# Patient Record
Sex: Male | Born: 1945 | Race: Black or African American | Hispanic: No | Smoking: Never smoker
Health system: Southern US, Community
[De-identification: ages and names within clinical notes are randomized; demographics above are authoritative.]

## PROBLEM LIST (undated history)

## (undated) DIAGNOSIS — F0390 Unspecified dementia without behavioral disturbance: Secondary | ICD-10-CM

## (undated) DIAGNOSIS — I639 Cerebral infarction, unspecified: Secondary | ICD-10-CM

## (undated) DIAGNOSIS — I10 Essential (primary) hypertension: Secondary | ICD-10-CM

## (undated) DIAGNOSIS — E785 Hyperlipidemia, unspecified: Secondary | ICD-10-CM

## (undated) DIAGNOSIS — E1149 Type 2 diabetes mellitus with other diabetic neurological complication: Secondary | ICD-10-CM

## (undated) DIAGNOSIS — D352 Benign neoplasm of pituitary gland: Secondary | ICD-10-CM

## (undated) HISTORY — PX: TONSILLECTOMY: SUR1361

## (undated) HISTORY — PX: KNEE ARTHROSCOPY: SUR90

## (undated) HISTORY — PX: OTHER SURGICAL HISTORY: SHX169

## (undated) HISTORY — PX: JOINT REPLACEMENT: SHX530

---

## 1999-10-29 ENCOUNTER — Encounter: Payer: Self-pay | Admitting: Physical Medicine and Rehabilitation

## 1999-10-29 ENCOUNTER — Ambulatory Visit (HOSPITAL_COMMUNITY)
Admission: RE | Admit: 1999-10-29 | Discharge: 1999-10-29 | Payer: Self-pay | Admitting: Physical Medicine and Rehabilitation

## 2000-10-31 ENCOUNTER — Emergency Department (HOSPITAL_COMMUNITY): Admission: EM | Admit: 2000-10-31 | Discharge: 2000-10-31 | Payer: Self-pay | Admitting: Emergency Medicine

## 2001-06-26 ENCOUNTER — Encounter: Payer: Self-pay | Admitting: Emergency Medicine

## 2001-06-26 ENCOUNTER — Emergency Department (HOSPITAL_COMMUNITY): Admission: EM | Admit: 2001-06-26 | Discharge: 2001-06-26 | Payer: Self-pay | Admitting: Emergency Medicine

## 2011-10-25 ENCOUNTER — Emergency Department (HOSPITAL_COMMUNITY): Payer: Medicare Other

## 2011-10-25 ENCOUNTER — Encounter (HOSPITAL_COMMUNITY): Payer: Self-pay | Admitting: *Deleted

## 2011-10-25 ENCOUNTER — Emergency Department (HOSPITAL_COMMUNITY)
Admission: EM | Admit: 2011-10-25 | Discharge: 2011-10-26 | Disposition: A | Discharge status: Home / Self Care | Payer: Medicare Other | Attending: Emergency Medicine | Admitting: Emergency Medicine

## 2011-10-25 DIAGNOSIS — E119 Type 2 diabetes mellitus without complications: Secondary | ICD-10-CM | POA: Insufficient documentation

## 2011-10-25 DIAGNOSIS — R221 Localized swelling, mass and lump, neck: Secondary | ICD-10-CM

## 2011-10-25 DIAGNOSIS — R531 Weakness: Secondary | ICD-10-CM

## 2011-10-25 DIAGNOSIS — R739 Hyperglycemia, unspecified: Secondary | ICD-10-CM

## 2011-10-25 DIAGNOSIS — R5381 Other malaise: Secondary | ICD-10-CM | POA: Insufficient documentation

## 2011-10-25 DIAGNOSIS — R220 Localized swelling, mass and lump, head: Secondary | ICD-10-CM | POA: Insufficient documentation

## 2011-10-25 DIAGNOSIS — Z794 Long term (current) use of insulin: Secondary | ICD-10-CM | POA: Insufficient documentation

## 2011-10-25 DIAGNOSIS — I10 Essential (primary) hypertension: Secondary | ICD-10-CM | POA: Insufficient documentation

## 2011-10-25 HISTORY — DX: Essential (primary) hypertension: I10

## 2011-10-25 LAB — GLUCOSE, CAPILLARY
Glucose-Capillary: 535 mg/dL — ABNORMAL HIGH (ref 70–99)
Glucose-Capillary: 394 mg/dL — ABNORMAL HIGH (ref 70–99)

## 2011-10-25 LAB — DIFFERENTIAL
Lymphocytes Relative: 35 % (ref 12–46)
Lymphs Abs: 1.9 10*3/uL (ref 0.7–4.0)
Monocytes Absolute: 0.4 10*3/uL (ref 0.1–1.0)
Monocytes Relative: 8 % (ref 3–12)
Neutro Abs: 2.7 10*3/uL (ref 1.7–7.7)
Neutrophils Relative %: 51 % (ref 43–77)

## 2011-10-25 LAB — HEPATIC FUNCTION PANEL
ALT: 20 U/L (ref 0–53)
AST: 20 U/L (ref 0–37)
Albumin: 4.4 g/dL (ref 3.5–5.2)
Bilirubin, Direct: 0.1 mg/dL (ref 0.0–0.3)
Total Bilirubin: 0.3 mg/dL (ref 0.3–1.2)

## 2011-10-25 LAB — BASIC METABOLIC PANEL
BUN: 17 mg/dL (ref 6–23)
CO2: 21 mEq/L (ref 19–32)
Chloride: 100 mEq/L (ref 96–112)
Creatinine, Ser: 1.05 mg/dL (ref 0.50–1.35)
Potassium: 3.9 mEq/L (ref 3.5–5.1)

## 2011-10-25 LAB — URINALYSIS, ROUTINE W REFLEX MICROSCOPIC
Glucose, UA: >1000 mg/dL — AB
Hgb urine dipstick: NEGATIVE
Protein, ur: NEGATIVE mg/dL
Specific Gravity, Urine: 1.033 — ABNORMAL HIGH (ref 1.005–1.030)
Urobilinogen, UA: 0.2 mg/dL (ref 0.0–1.0)

## 2011-10-25 LAB — URINE MICROSCOPIC-ADD ON

## 2011-10-25 LAB — CBC
HCT: 42.9 % (ref 39.0–52.0)
Hemoglobin: 14.7 g/dL (ref 13.0–17.0)
RBC: 4.7 MIL/uL (ref 4.22–5.81)
WBC: 5.3 10*3/uL (ref 4.0–10.5)

## 2011-10-25 MED ORDER — INSULIN REGULAR HUMAN 100 UNIT/ML IJ SOLN: 10.0000 [IU] | Freq: Once | INTRAMUSCULAR | Status: DC

## 2011-10-25 MED ORDER — INSULIN ASPART 100 UNIT/ML ~~LOC~~ SOLN
10.0000 [IU] | Freq: Once | SUBCUTANEOUS | Status: AC
  Administered 2011-10-25: 10 [IU] via INTRAVENOUS
  Filled 2011-10-25: qty 1

## 2011-10-25 MED ORDER — SODIUM CHLORIDE 0.9 % IV BOLUS (SEPSIS)
1000.0000 mL | Freq: Once | INTRAVENOUS | Status: AC
  Administered 2011-10-25: 1000 mL via INTRAVENOUS

## 2011-10-25 NOTE — ED Notes (Signed)
Patient sts he has been having trouble with his new insulin and that he has been having headache, tingling in his fingers, dizziness and lightheadedness. Patient sts that he has some trouble communicating with his PCP. Patient sts he has been having trouble with the new insulin his PCP prescribed him and has tried to explain this to him, but PCP will not prescribe his old insulin. Sts he believes this is where his hyperglycemia is coming from.

## 2011-10-25 NOTE — ED Provider Notes (Addendum)
History     CSN: [id number]  Arrival date & time 10/25/11  4098   First MD Initiated Contact with Patient 10/25/11 2133      Chief Complaint  Patient presents with  . Hyperglycemia    (Consider location/radiation/quality/duration/timing/severity/associated sxs/prior treatment) HPI  H/o IDDM pw hyperglycemia. Notes glu 400s-500s all day. Has been taking insulin as prescribed. States glucose was controlled previously but started on insulin pens 2-3 weeks ago. He is unsure if he's using them correctly. +Increased thirst and urination. C/O generalized weakness. Also with worsening productive cough. Denies cp/sob. Nausea without vomiting x 2-3 days. Denies cp/sob/palpitations. Denies fever/chills. +urinary frequency, denies urinary urgency, hematuria. Denies sick contacts. Has b/l hand/finger numbness present for years and worsened over past few weeks. Also concerned about left neck swelling which has been present for 5-6 years and worse over past 8 months.    ED Notes, ED Provider Notes from 10/25/11 0000 to 10/25/11 20:42:31       Tammy Clois Comber, RN 10/25/2011 20:40      Pt presents with elevated glucose; changed from novolin/regular to a "pen" form of insulin; pt states since the change his sugars have remained elevated; numbness in fingers for "years"-increase episodes lately     Past Medical History  Diagnosis Date  . Diabetes mellitus   . Hypertension     Past Surgical History  Procedure Date  . Knee arthroscopy     No family history on file.  History  Substance Use Topics  . Smoking status: Never Smoker   . Smokeless tobacco: Not on file  . Alcohol Use: No    Review of Systems  All other systems reviewed and are negative.  except as noted HPI   Allergies  Review of patient's allergies indicates no known allergies.  Home Medications   Current Outpatient Rx  Name Route Sig Dispense Refill  . INSULIN ASPART PROT & ASPART (70-30) 100 UNIT/ML  SUSP  Subcutaneous Inject 12 Units into the skin 3 (three) times daily.      BP 148/88  Pulse 102  Temp(Src) 99 F (37.2 C) (Oral)  Resp 18  Wt 230 lb (104.327 kg)  SpO2 100%  Physical Exam  Nursing note and vitals reviewed. Constitutional: He is oriented to person, place, and time. He appears well-developed and well-nourished. No distress.  HENT:  Head: Atraumatic.  Mouth/Throat: Oropharynx is clear and moist.  Eyes: Conjunctivae are normal. Pupils are equal, round, and reactive to light.  Neck: Neck supple.       Lt LAD v/s superficial mass  Cardiovascular: Normal rate, regular rhythm, normal heart sounds and intact distal pulses.  Exam reveals no gallop and no friction rub.   No murmur heard. Pulmonary/Chest: Effort normal. No respiratory distress. He has no wheezes. He has no rales.  Abdominal: Soft. Bowel sounds are normal. There is no tenderness. There is no rebound and no guarding.  Musculoskeletal: Normal range of motion. He exhibits no edema and no tenderness.  Neurological: He is alert and oriented to person, place, and time.  Skin: Skin is warm and dry.  Psychiatric: He has a normal mood and affect.    Date: 11/23/2011  Rate: 79  Rhythm: normal sinus rhythm  QRS Axis: left  Intervals: normal  ST/T Wave abnormalities: normal  Conduction Disutrbances:left anterior fascicular block  Narrative Interpretation:   Old EKG Reviewed: none to compare    ED Course  Procedures (including critical care time)  Labs Reviewed  GLUCOSE, CAPILLARY -  Abnormal; Notable for the following:    Glucose-Capillary 535 (*)    All other components within normal limits  DIFFERENTIAL - Abnormal; Notable for the following:    Eosinophils Relative 6 (*)    All other components within normal limits  BASIC METABOLIC PANEL - Abnormal; Notable for the following:    Sodium 134 (*)    Glucose, Bld 469 (*)    GFR calc non Af Amer 73 (*)    GFR calc Af Amer 84 (*)    All other components within  normal limits  HEPATIC FUNCTION PANEL - Abnormal; Notable for the following:    Alkaline Phosphatase 138 (*)    Indirect Bilirubin 0.2 (*)    All other components within normal limits  URINALYSIS, ROUTINE W REFLEX MICROSCOPIC - Abnormal; Notable for the following:    Specific Gravity, Urine 1.033 (*)    Glucose, UA >1000 (*)    Bilirubin Urine MODERATE (*)    Ketones, ur TRACE (*)    All other components within normal limits  GLUCOSE, CAPILLARY - Abnormal; Notable for the following:    Glucose-Capillary 394 (*)    All other components within normal limits  GLUCOSE, CAPILLARY - Abnormal; Notable for the following:    Glucose-Capillary 402 (*)    All other components within normal limits  CBC  TROPONIN I  URINE MICROSCOPIC-ADD ON   Dg Chest 2 View  10/25/2011  *RADIOLOGY REPORT*  Clinical Data: Hyperglycemia.  Diabetes.  CHEST - 2 VIEW  Comparison: None.  Findings: Midline trachea.  Normal heart size and mediastinal contours. No pleural effusion or pneumothorax.  Clear lungs.  IMPRESSION: No acute cardiopulmonary disease.  Original Report Authenticated By: Consuello Bossier, M.D.     1. Hyperglycemia   2. Weakness   3. Neck mass     MDM  65yoM h/o IDDM pw hyperglycemia and weakness. He states he's been compliant with his insulin however he is unsure if he's actually getting enough insulin out of his pens which are new for him. CXR and UA unremarkable for infection. Hyperglycemia but labs otherwise unremarkable. Bicarb wnl and AG 13. Will treat with IVF and insulin. Discharge home with PMD f/u when glucose controlled. He will call tomorrow for adjustments to his insulin regimen. To f/u with them for neck mass (chronic) as well.   Forbes Cellar, MD 10/25/11 8657  Forbes Cellar, MD 10/25/11 8469  Forbes Cellar, MD 11/23/11 1146  Forbes Cellar, MD 11/23/11 1146

## 2011-10-25 NOTE — Discharge Instructions (Signed)
Hyperglycemia Hyperglycemia occurs when the glucose (sugar) in your blood is too high. Hyperglycemia can happen for many reasons, but it most often happens to people who do not know they have diabetes or are not managing their diabetes properly.  CAUSES  Whether you have diabetes or not, there are other causes of hyperglycemia. Hyperglycemia can occur when you have diabetes, but it can also occur in other situations that you might not be as aware of, such as: Diabetes  If you have diabetes and are having problems controlling your blood glucose, hyperglycemia could occur because of some of the following reasons:   Not following your meal plan.   Not taking your diabetes medications or not taking it properly.   Exercising less or doing less activity than you normally do.   Being sick.  Pre-diabetes  This cannot be ignored. Before people develop Type 2 diabetes, they almost always have "pre-diabetes." This is when your blood glucose levels are higher than normal, but not yet high enough to be diagnosed as diabetes. Research has shown that some long-term damage to the body, especially the heart and circulatory system, may already be occurring during pre-diabetes. If you take action to manage your blood glucose when you have pre-diabetes, you may delay or prevent Type 2 diabetes from developing.  Stress  If you have diabetes, you may be "diet" controlled or on oral medications or insulin to control your diabetes. However, you may find that your blood glucose is higher than usual in the hospital whether you have diabetes or not. This is often referred to as "stress hyperglycemia." Stress can elevate your blood glucose. This happens because of hormones put out by the body during times of stress. If stress has been the cause of your high blood glucose, it can be followed regularly by your caregiver. That way he/she can make sure your hyperglycemia does not continue to get worse or progress to diabetes.    Steroids  Steroids are medications that act on the infection fighting system (immune system) to block inflammation or infection. One side effect can be a rise in blood glucose. Most people can produce enough extra insulin to allow for this rise, but for those who cannot, steroids make blood glucose levels go even higher. It is not unusual for steroid treatments to "uncover" diabetes that is developing. It is not always possible to determine if the hyperglycemia will go away after the steroids are stopped. A special blood test called an A1c is sometimes done to determine if your blood glucose was elevated before the steroids were started.  SYMPTOMS  Thirsty.   Frequent urination.   Dry mouth.   Blurred vision.   Tired or fatigue.   Weakness.   Sleepy.   Tingling in feet or leg.  DIAGNOSIS  Diagnosis is made by monitoring blood glucose in one or all of the following ways:  A1c test. This is a chemical found in your blood.   Fingerstick blood glucose monitoring.   Laboratory results.  TREATMENT  First, knowing the cause of the hyperglycemia is important before the hyperglycemia can be treated. Treatment may include, but is not be limited to:  Education.   Change or adjustment in medications.   Change or adjustment in meal plan.   Treatment for an illness, infection, etc.   More frequent blood glucose monitoring.   Change in exercise plan.   Decreasing or stopping steroids.   Lifestyle changes.  HOME CARE INSTRUCTIONS   Test your blood glucose   as directed.   Exercise regularly. Your caregiver will give you instructions about exercise. Pre-diabetes or diabetes which comes on with stress is helped by exercising.   Eat wholesome, balanced meals. Eat often and at regular, fixed times. Your caregiver or nutritionist will give you a meal plan to guide your sugar intake.   Being at an ideal weight is important. If needed, losing as little as 10 to 15 pounds may help  improve blood glucose levels.  SEEK MEDICAL CARE IF:   You have questions about medicine, activity, or diet.   You continue to have symptoms (problems such as increased thirst, urination, or weight gain).  SEEK IMMEDIATE MEDICAL CARE IF:   You are vomiting or have diarrhea.   Your breath smells fruity.   You are breathing faster or slower.   You are very sleepy or incoherent.   You have numbness, tingling, or pain in your feet or hands.   You have chest pain.   Your symptoms get worse even though you have been following your caregiver's orders.   If you have any other questions or concerns.  Document Released: 10/27/2000 Document Revised: 04/22/2011 Document Reviewed: 12/23/2008 ExitCare Patient Information 2012 [employer].  RESOURCE GUIDE  Dental Problems  Patients with Medicaid: [facility]                     [facility] [address]                                           [address] Phone:  [phone]                                                   Phone:  [phone]  If unable to pay or uninsured, contact:  [facility] or [facility] to become qualified for the adult dental clinic.  Chronic Pain Problems Contact [facility]  [phone] Patients need to be referred by their primary care doctor.  Insufficient Money for Medicine Contact [employer]:  call "211" or [facility] 271-5999.  No Primary Care Doctor Call [facility]  832-8000 Other agencies that provide inexpensive medical care    [facility]  [phone]    [facility]  [phone]    [facility]  [phone]    [facility]  [phone]    [facility]  [phone]    [facility]  [phone]  [facility]  [phone] [facility]  [phone] [facility]   [phone] (emergency services  [phone])  Abuse/Neglect [place] Child Abuse Hotline (336) 641-3795 [place] [facility] Abuse Hotline 800-378-5315 (After Hours)  Emergency Shelter  [facility] (336) 271-5985  [facility] (336) 275-9566 [facility] (704) 372-4663  MRSA Hotline #:   832-7006    [place] [facility]  [facility] [place]  [employer]                           [facility] [address] [place]                       [address]         [address]  [place]                                               [place]                              [place] Phone:  [phone]                                  Phone:  [phone]                   Phone:  [phone]  [facility] Phone:  [phone]  [place] Child Abuse Hotline (336) 342-1394 (336) 342-3537 (After Hours)  

## 2011-10-25 NOTE — ED Notes (Signed)
Pt presents with elevated glucose; changed from novolin/regular to a "pen" form of insulin; pt states since the change his sugars have remained elevated; numbness in fingers for "years"-increase episodes lately

## 2011-10-25 NOTE — ED Notes (Signed)
Urinal given to patient. No assistance needed.

## 2011-10-26 LAB — GLUCOSE, CAPILLARY

## 2011-10-26 MED ORDER — INSULIN NPH (HUMAN) (ISOPHANE) 100 UNIT/ML ~~LOC~~ SUSP: 12.0000 [IU] | Freq: Two times a day (BID) | SUBCUTANEOUS | Status: AC

## 2011-10-26 MED ORDER — INSULIN REGULAR HUMAN 100 UNIT/ML IJ SOLN: 25.0000 [IU] | Freq: Two times a day (BID) | INTRAMUSCULAR | Status: AC

## 2011-10-26 NOTE — ED Notes (Signed)
Per PharmD's request pt's insulin changed to novolin from Humulin, due to the novolin being covered by pt's insurance.  Ok by MD Bernette Mayers.

## 2011-10-26 NOTE — ED Provider Notes (Signed)
CBG improved. Pt has not had good control of sugar at home with Insulin pen and would like to return to his previous regimen. He is not sure of his prior dose but has it written down at home. Given 1 vial each of his prior meds and advised close followup with PCP at the [place].   Ruben Diaz B. Bernette Mayers, MD 10/26/11 [phone]

## 2011-10-26 NOTE — ED Notes (Signed)
Patient given discharge instructions, information, prescriptions, and diet order. Patient states that they adequately understand discharge information given and to return to ED if symptoms return or worsen.    Patient given information about hyperglycemia and ways to manage his blood sugar. Patients girlfriend at bedside to hear education as well. Patient sts that he understands.

## 2014-04-30 ENCOUNTER — Emergency Department (HOSPITAL_COMMUNITY)
Admission: EM | Admit: 2014-04-30 | Discharge: 2014-04-30 | Discharge status: Against Medical Advice | Payer: Medicare Other | Attending: Emergency Medicine | Admitting: Emergency Medicine

## 2014-04-30 ENCOUNTER — Encounter (HOSPITAL_COMMUNITY): Payer: Self-pay | Admitting: Emergency Medicine

## 2014-04-30 DIAGNOSIS — R531 Weakness: Secondary | ICD-10-CM | POA: Insufficient documentation

## 2014-04-30 DIAGNOSIS — I10 Essential (primary) hypertension: Secondary | ICD-10-CM | POA: Diagnosis not present

## 2014-04-30 DIAGNOSIS — E119 Type 2 diabetes mellitus without complications: Secondary | ICD-10-CM | POA: Insufficient documentation

## 2014-04-30 HISTORY — DX: Cerebral infarction, unspecified: I63.9

## 2014-04-30 NOTE — ED Notes (Addendum)
Patient observed walking out the back door towards triage.  Inquired with the patient where he was going.  Patient stated, "I'm going somewhere else, I ain't gonna lay here and die."  Informed patient he had not been in the ED for an hour yet and if he would return to his room, a provider would come see him shortly.  Patient responded, "I ain't waiting an hour, you crazy lady!  I'm going somewhere else."  Patient left via the front doors.

## 2014-04-30 NOTE — ED Notes (Signed)
Pt was visualized leaving the department, pt states "I don't want no help", ambulates to car.  Charge RN aware.

## 2014-04-30 NOTE — ED Notes (Signed)
Pt alert, arrives from home, drove self to ER, c/o weakness, loneliness, pt looking for someone to help him a little bit, ambulates to triage, resp even unlabored, skin pwd

## 2014-09-25 ENCOUNTER — Encounter (HOSPITAL_COMMUNITY): Payer: Self-pay

## 2014-09-25 ENCOUNTER — Emergency Department (HOSPITAL_COMMUNITY): Payer: Medicare Other

## 2014-09-25 ENCOUNTER — Emergency Department (HOSPITAL_COMMUNITY)
Admission: EM | Admit: 2014-09-25 | Discharge: 2014-09-25 | Disposition: A | Discharge status: Home / Self Care | Payer: Medicare Other | Attending: Emergency Medicine | Admitting: Emergency Medicine

## 2014-09-25 DIAGNOSIS — Z794 Long term (current) use of insulin: Secondary | ICD-10-CM | POA: Insufficient documentation

## 2014-09-25 DIAGNOSIS — E1165 Type 2 diabetes mellitus with hyperglycemia: Secondary | ICD-10-CM | POA: Diagnosis not present

## 2014-09-25 DIAGNOSIS — I10 Essential (primary) hypertension: Secondary | ICD-10-CM | POA: Insufficient documentation

## 2014-09-25 DIAGNOSIS — Z8673 Personal history of transient ischemic attack (TIA), and cerebral infarction without residual deficits: Secondary | ICD-10-CM | POA: Diagnosis not present

## 2014-09-25 DIAGNOSIS — Z7982 Long term (current) use of aspirin: Secondary | ICD-10-CM | POA: Insufficient documentation

## 2014-09-25 DIAGNOSIS — R739 Hyperglycemia, unspecified: Secondary | ICD-10-CM

## 2014-09-25 DIAGNOSIS — R4182 Altered mental status, unspecified: Secondary | ICD-10-CM | POA: Insufficient documentation

## 2014-09-25 LAB — CBC
HCT: 40.7 % (ref 39.0–52.0)
Hemoglobin: 13.2 g/dL (ref 13.0–17.0)
MCH: 30.6 pg (ref 26.0–34.0)
MCHC: 32.4 g/dL (ref 30.0–36.0)
MCV: 94.2 fL (ref 78.0–100.0)
PLATELETS: 298 10*3/uL (ref 150–400)
RBC: 4.32 MIL/uL (ref 4.22–5.81)
RDW: 13.6 % (ref 11.5–15.5)
WBC: 4.8 10*3/uL (ref 4.0–10.5)

## 2014-09-25 LAB — COMPREHENSIVE METABOLIC PANEL
ALT: 15 U/L — ABNORMAL LOW (ref 17–63)
ANION GAP: 8 (ref 5–15)
AST: 14 U/L — AB (ref 15–41)
Albumin: 4.1 g/dL (ref 3.5–5.0)
Alkaline Phosphatase: 85 U/L (ref 38–126)
BUN: 13 mg/dL (ref 6–20)
CALCIUM: 9.6 mg/dL (ref 8.9–10.3)
CO2: 23 mmol/L (ref 22–32)
CREATININE: 0.88 mg/dL (ref 0.61–1.24)
Chloride: 107 mmol/L (ref 101–111)
Glucose, Bld: 273 mg/dL — ABNORMAL HIGH (ref 70–99)
Potassium: 4.1 mmol/L (ref 3.5–5.1)
Sodium: 138 mmol/L (ref 135–145)
Total Bilirubin: 0.8 mg/dL (ref 0.3–1.2)
Total Protein: 6.9 g/dL (ref 6.5–8.1)

## 2014-09-25 LAB — RAPID URINE DRUG SCREEN, HOSP PERFORMED
AMPHETAMINES: NOT DETECTED
BENZODIAZEPINES: NOT DETECTED
Barbiturates: NOT DETECTED
COCAINE: NOT DETECTED
Opiates: NOT DETECTED
TETRAHYDROCANNABINOL: NOT DETECTED

## 2014-09-25 LAB — ACETAMINOPHEN LEVEL

## 2014-09-25 LAB — CBG MONITORING, ED
Glucose-Capillary: 240 mg/dL — ABNORMAL HIGH (ref 70–99)
Glucose-Capillary: 179 mg/dL — ABNORMAL HIGH (ref 70–99)

## 2014-09-25 LAB — URINALYSIS, ROUTINE W REFLEX MICROSCOPIC
BILIRUBIN URINE: NEGATIVE
Glucose, UA: >1000 mg/dL — AB
Hgb urine dipstick: NEGATIVE
KETONES UR: NEGATIVE mg/dL
LEUKOCYTES UA: NEGATIVE
Nitrite: NEGATIVE
PROTEIN: NEGATIVE mg/dL
SPECIFIC GRAVITY, URINE: 1.025 (ref 1.005–1.030)
UROBILINOGEN UA: 1 mg/dL (ref 0.0–1.0)
pH: 5.5 (ref 5.0–8.0)

## 2014-09-25 LAB — ETHANOL

## 2014-09-25 LAB — SALICYLATE LEVEL: Salicylate Lvl: <4 mg/dL (ref 2.8–30.0)

## 2014-09-25 MED ORDER — SODIUM CHLORIDE 0.9 % IV BOLUS (SEPSIS)
1000.0000 mL | Freq: Once | INTRAVENOUS | Status: AC
  Administered 2014-09-25: 1000 mL via INTRAVENOUS

## 2014-09-25 NOTE — ED Provider Notes (Signed)
CSN: [id number]     Arrival date & time 09/25/14  1504 History   First MD Initiated Contact with Patient 09/25/14 1543     Chief Complaint  Patient presents with  . Hyperglycemia  . Fatigue     (Consider location/radiation/quality/duration/timing/severity/associated sxs/prior Treatment) The history is provided by the patient.  Ramel E Saiz is a 69 y.o. male hx of DM, HTN, stroke here with altered mental status, hyperglycemia. Patient lives at home currently. He has a visiting nurse that comes to give him his medications. He states that he doesn't understand why he has diabetes and has been consistently refusing medications from the nurse. He states that he has been having some auditory hallucinations. He here people talking outside but doesn't see them but they don't talk to him. He doesn't know why the nurse called EMS to get him here. Denies any suicidal homicidal ideations. States he is tired all the time. Denies any fevers or chills or abdominal pain or vomiting.    Past Medical History  Diagnosis Date  . Diabetes mellitus   . Hypertension   . Stroke    Past Surgical History  Procedure Laterality Date  . Knee arthroscopy     History reviewed. No pertinent family history. History  Substance Use Topics  . Smoking status: Never Smoker   . Smokeless tobacco: Not on file  . Alcohol Use: No    Review of Systems  Neurological: Positive for weakness.  Psychiatric/Behavioral: Positive for hallucinations.  All other systems reviewed and are negative.     Allergies  Review of patient's allergies indicates no known allergies.  Home Medications   Prior to Admission medications   Medication Sig Start Date End Date Taking? Authorizing Provider  aspirin EC 81 MG tablet Take 162 mg by mouth daily.   Yes Historical Provider, MD  insulin aspart protamine-insulin aspart (NOVOLOG 70/30) (70-30) 100 UNIT/ML injection Inject 12 Units into the skin 3 (three) times daily.   Yes  Historical Provider, MD   BP 185/88 mmHg  Pulse 72  Temp(Src) 99.6 F (37.6 C) (Oral)  Resp 18  SpO2 100% Physical Exam  Constitutional: He is oriented to person, place, and time.  Slightly dehydrated   HENT:  Head: Normocephalic.  MM slightly dry   Eyes: Conjunctivae are normal. Pupils are equal, round, and reactive to light.  Neck: Normal range of motion. Neck supple.  Cardiovascular: Normal rate, regular rhythm and normal heart sounds.   Pulmonary/Chest: Effort normal and breath sounds normal. No respiratory distress. He has no wheezes. He has no rales.  Abdominal: Soft. Bowel sounds are normal. He exhibits no distension. There is no tenderness. There is no rebound.  Musculoskeletal: Normal range of motion.  Neurological: He is alert and oriented to person, place, and time. No cranial nerve deficit. Coordination normal.  Skin: Skin is warm and dry.  Psychiatric:  Poor judgment   Nursing note and vitals reviewed.   ED Course  Procedures (including critical care time) Labs Review Labs Reviewed  URINALYSIS, ROUTINE W REFLEX MICROSCOPIC - Abnormal; Notable for the following:    Glucose, UA >1000 (*)    All other components within normal limits  COMPREHENSIVE METABOLIC PANEL - Abnormal; Notable for the following:    Glucose, Bld 273 (*)    AST 14 (*)    ALT 15 (*)    All other components within normal limits  ACETAMINOPHEN LEVEL - Abnormal; Notable for the following:    Acetaminophen (Tylenol), Serum <10 (*)  All other components within normal limits  CBG MONITORING, ED - Abnormal; Notable for the following:    Glucose-Capillary 179 (*)    All other components within normal limits  CBC  ETHANOL  SALICYLATE LEVEL  URINE RAPID DRUG SCREEN (HOSP PERFORMED)  URINE MICROSCOPIC-ADD ON  CBG MONITORING, ED    Imaging Review Dg Chest 2 View  09/25/2014   CLINICAL DATA:  69 year old male with hyperglycemia and fatigue. Initial encounter.  EXAM: CHEST  2 VIEW  COMPARISON:   10/25/2011.  FINDINGS: Lung volumes remain normal. Normal cardiac size and mediastinal contours. Visualized tracheal air column is within normal limits. No pneumothorax, pulmonary edema, pleural effusion or confluent pulmonary opacity. No acute osseous abnormality identified.  IMPRESSION: Negative, no acute cardiopulmonary abnormality.   Electronically Signed   By: Genevie Ann M.D.   On: 09/25/2014 16:22   Ct Head Wo Contrast  09/25/2014   CLINICAL DATA:  Altered mental status.  Hyperglycemia  EXAM: CT HEAD WITHOUT CONTRAST  TECHNIQUE: Contiguous axial images were obtained from the base of the skull through the vertex without intravenous contrast.  COMPARISON:  CT head 12/04/2012  FINDINGS: Progression of atrophy.  Negative for hydrocephalus.  Negative for acute infarct. Mild chronic microvascular ischemic change in the white matter. Negative for hemorrhage or mass  Calvarium intact.  IMPRESSION: Progressive atrophy with mild chronic microvascular ischemic change. No acute abnormality.   Electronically Signed   By: Franchot Gallo M.D.   On: 09/25/2014 16:36     EKG Interpretation None      MDM   Final diagnoses:  None    Lonny E Rodelo is a 69 y.o. male here with hyperglycemia, generalized weakness, some hallucinations. Denies suicidal or homicidal ideations and he can't hear what the voices are saying. Consider stroke vs infection vs electrolyte imbalance. Will call case management to help with social situation.   8:00 PM Case management contacted [facility], his home care service, who states that patient was sent here because he doesn't take care of himself. Patient states that he is in his home for 40 years and feels safe and doesn't want placement. Doesn't want additional services. Doesn't meet criteria for IVC. Labs and CT head unremarkable except for hyperglycemia that improved with fluids. Will dc home. Told him to take his meds as prescribed.     Wandra Arthurs, MD 09/25/14 2002

## 2014-09-25 NOTE — ED Notes (Signed)
Patient transported to CT and X ray 

## 2014-09-25 NOTE — ED Notes (Signed)
Pt c/o chronic bilateral hand "throbbing."  Sts "they turn blood red and I don't know why."

## 2014-09-25 NOTE — Progress Notes (Signed)
CSW met with pt at bedside. There was no family present. Patient appeared to be frustrated during the interview.  Per note, pt presents to [facility] due to Hyperglycemia and fatigue.  Patient informed CSW that he lives alone in [place]. CSW asked pt if he feels safe retuning to his home. Pt stated " Why wouldn't I feel safe? I have been there for 35 years." The pt was easily distracted during the interview and continued to talk about how he felt the bed was uncomfortable. Pt stated " Arnetha Gula got the wrong one to think Im going to lay on this baby bed all night!"  Patient informed CSW that he completes his ADL's independently. He also informed CSW that he is a diabetic.    CSW consulted with nurse who states the pt will need transportation home. CSW supplied the pt with a taxi voucher and called [employer].   Willette Brace [phone] ED CSW 09/25/2014 10:19 PM

## 2014-09-25 NOTE — ED Notes (Signed)
Per EMS, Pt, from home, c/o hyperglycemia and fatigue.  Pt reports fatigue is worse today.  Pt has not been checking blood glucose and non-compliant w/ medications.  Pt was seen by [facility] for evaluation and she called EMS.
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
## 2014-09-25 NOTE — ED Notes (Signed)
Pt given sandwich and something to drink before hes discharged.

## 2014-09-25 NOTE — ED Notes (Signed)
EDP at bedside w/ pt

## 2014-09-25 NOTE — ED Notes (Signed)
Pt reported to EMS that he is followed by a Case Manager.

## 2014-09-25 NOTE — Discharge Instructions (Signed)
Take your medicines as prescribed.  Ruben Williams will contact you.   Follow up with your doctor.   Return to ER if you have thoughts of harming yourself or others, hallucinations, worse weakness, sugar greater than 500 or less than 50.

## 2014-09-25 NOTE — ED Notes (Signed)
Taxi voucher given to pt. 

## 2014-09-25 NOTE — Progress Notes (Addendum)
[facility] consulted earlier in shift for medication needs.  EDCM spoke to patient at bedside.  Patient reports he lives alone.  Patient reports hehe is able to perform ADL's on his own.  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  But then patient reports, "The girls from the [facility] sent me here today."  Patient appears confused and possibly hard of hearing.  Patient stated, "I can drive, I just stopped driving because of my eye."  Patient confirms he stayed at [place] in [place] for five days.  Patient reports he is unhappy with his pcp at the [place] and has requested a new one but has been unable to do so.  Patient informed EDCM that he, "Goes to [employer] and buys insulin."  Patient states he doesn't want to take the insulin his pcp prescribed, so he went to [place] and bought his own.  [facility] informed patient of how dangerous that was to do change his insulin.  Then patient reports he is now taking the insulin his pcp prescribed him.  Patient reports he needs someone to talk too at home.  EDCM called and spoke to [place] at 1903pm who reports patient is currently active with them for RN services.  Suanne Marker reports patient is to have psychiatric RN see him.  Alvis Lemmings is currently trying to assist patient along with the [facility] [place].  Suanne Marker reports patient , "Needs to be placed.  Needs to be transferred to [facility]."  Suanne Marker reports an APS report has been completed and "APS will not place him because he can take care of himself."  Suanne Marker reports patient wanders from home and will not allow visiting RN to check his fridge to see if he even has insulin.  Suanne Marker reports she will consult with her social worker and contact the [facility] [place] in the am regarding this patient.  Suanne Marker reports they were able to reach a brother for the patient in the past, but the patient was  angry with home health agency for letting his brother know his business and he and his brother did not get along.  Suanne Marker reports the patient is noncompliant. EDCM received call from [place] who reports no medical reason to admit at this time and will be discharging home. [facility] consulted EDSW to see patient.  EDCM asked EDSW to file another APS report on this patient.  EDCM discussed patient with EDP who will place orders for aide, social worker and face to face.  No further EDCM needs at this time.  09/25/2014 A. Christen Bame RNCM 2122pm  [facility] faxed home health orders to [facility] at 2108pm with confirmation of receipt at 2111pm.  Per earlier conversation with patient he reports he does not require any further dme in the home.  No further EDCM needs at this time.

## 2014-09-25 NOTE — ED Notes (Signed)
Pt yelling in room that he wants to go home, states "yall gave me a half of a bed", "I'm not staying in this 69 year old bed". Pt requesting to talk to the doctor, will make EDP aware.

## 2014-09-25 NOTE — ED Notes (Signed)
Bed: WA16 Expected date:  Expected time:  Means of arrival:  Comments: EMS 

## 2014-10-01 ENCOUNTER — Emergency Department (HOSPITAL_COMMUNITY)
Admission: EM | Admit: 2014-10-01 | Discharge: 2014-10-01 | Disposition: A | Discharge status: Home / Self Care | Payer: Medicare Other | Attending: Emergency Medicine | Admitting: Emergency Medicine

## 2014-10-01 ENCOUNTER — Encounter (HOSPITAL_COMMUNITY): Payer: Self-pay | Admitting: Emergency Medicine

## 2014-10-01 DIAGNOSIS — Z8673 Personal history of transient ischemic attack (TIA), and cerebral infarction without residual deficits: Secondary | ICD-10-CM | POA: Diagnosis not present

## 2014-10-01 DIAGNOSIS — I10 Essential (primary) hypertension: Secondary | ICD-10-CM | POA: Insufficient documentation

## 2014-10-01 DIAGNOSIS — Z7982 Long term (current) use of aspirin: Secondary | ICD-10-CM | POA: Diagnosis not present

## 2014-10-01 DIAGNOSIS — F419 Anxiety disorder, unspecified: Secondary | ICD-10-CM | POA: Insufficient documentation

## 2014-10-01 DIAGNOSIS — E119 Type 2 diabetes mellitus without complications: Secondary | ICD-10-CM | POA: Diagnosis present

## 2014-10-01 DIAGNOSIS — E1165 Type 2 diabetes mellitus with hyperglycemia: Secondary | ICD-10-CM | POA: Insufficient documentation

## 2014-10-01 DIAGNOSIS — R739 Hyperglycemia, unspecified: Secondary | ICD-10-CM

## 2014-10-01 DIAGNOSIS — Z794 Long term (current) use of insulin: Secondary | ICD-10-CM | POA: Diagnosis not present

## 2014-10-01 LAB — I-STAT CHEM 8, ED
BUN: 10 mg/dL (ref 6–20)
CALCIUM ION: 1.26 mmol/L (ref 1.13–1.30)
CREATININE: 0.8 mg/dL (ref 0.61–1.24)
Chloride: 103 mmol/L (ref 101–111)
Glucose, Bld: 147 mg/dL — ABNORMAL HIGH (ref 65–99)
HCT: 41 % (ref 39.0–52.0)
Hemoglobin: 13.9 g/dL (ref 13.0–17.0)
Potassium: 3.7 mmol/L (ref 3.5–5.1)
Sodium: 140 mmol/L (ref 135–145)
TCO2: 21 mmol/L (ref 0–100)

## 2014-10-01 LAB — CBG MONITORING, ED: Glucose-Capillary: 172 mg/dL — ABNORMAL HIGH (ref 65–99)

## 2014-10-01 MED ORDER — ACETAMINOPHEN 325 MG PO TABS
650.0000 mg | ORAL_TABLET | Freq: Once | ORAL | Status: AC
  Administered 2014-10-01: 650 mg via ORAL
  Filled 2014-10-01: qty 2

## 2014-10-01 NOTE — ED Provider Notes (Signed)
CSN: [id number]     Arrival date & time 10/01/14  0248 History  This chart was scribed for Ruben Fraise, MD by Rayfield Citizen, ED Scribe. This patient was seen in room B16C/B16C and the patient's care was started at 3:17 AM.    Chief Complaint  Patient presents with  . Diabetes  . Anxiety   Patient is a 69 y.o. male presenting with diabetes problem. The history is provided by the patient. No language interpreter was used.  Diabetes This is a chronic problem. The current episode started 1 to 2 hours ago (Patient felt his CBG became emergently low within the past several hours). The problem occurs every several days. The problem has been rapidly improving. Associated symptoms include headaches. Pertinent negatives include no chest pain and no shortness of breath. Nothing aggravates the symptoms. Nothing relieves the symptoms. He has tried nothing for the symptoms. The treatment provided no relief.   HPI Comments: Ruben Williams is a 69 y.o. male with past medical history of DM, HTN, stroke who presents to the Emergency Department complaining of diabetic issue. Patient explains he "must have forgotten to take his insulin," as he woke from sleep tonight in an altered state with confusion, ataxia, dizziness and near syncope. Patient reports he called EMS at that time. He also reports headache and mild cough. He denies diplopia, new or unusual vision changes (some vision loss in right eye at baseline after surgery), chest pain, SOB, syncope.   Past Medical History  Diagnosis Date  . Diabetes mellitus   . Hypertension   . Stroke    Past Surgical History  Procedure Laterality Date  . Knee arthroscopy     No family history on file. History  Substance Use Topics  . Smoking status: Never Smoker   . Smokeless tobacco: Not on file  . Alcohol Use: No    Review of Systems  Constitutional: Negative for fever.  Eyes: Negative for visual disturbance.  Respiratory: Negative for shortness of breath.    Cardiovascular: Negative for chest pain.  Neurological: Positive for dizziness, syncope (Near syncope) and headaches.  Psychiatric/Behavioral: Positive for confusion. The patient is nervous/anxious.   All other systems reviewed and are negative.   Allergies  Review of patient's allergies indicates no known allergies.  Home Medications   Prior to Admission medications   Medication Sig Start Date End Date Taking? Authorizing Provider  aspirin EC 81 MG tablet Take 162 mg by mouth daily.    Historical Provider, MD  insulin aspart protamine-insulin aspart (NOVOLOG 70/30) (70-30) 100 UNIT/ML injection Inject 12 Units into the skin 3 (three) times daily.    Historical Provider, MD   BP 149/78 mmHg  Pulse 71  Temp(Src) 98 F (36.7 C) (Oral)  Resp 15  SpO2 100% Physical Exam  Nursing note and vitals reviewed.  CONSTITUTIONAL: Well developed/well nourished HEAD: Normocephalic/atraumatic EYES: EOMI/PERRL, no nystagmus, no ptosis ENMT: Mucous membranes moist NECK: supple no meningeal signs, no bruits SPINE/BACK:entire spine nontender CV: S1/S2 noted, no murmurs/rubs/gallops noted LUNGS: Lungs are clear to auscultation bilaterally, no apparent distress ABDOMEN: soft, nontender, no rebound or guarding GU:no cva tenderness NEURO:Awake/alert, facies symmetric, no arm or leg drift is noted Equal 5/5 strength with shoulder abduction, elbow flex/extension, wrist flex/extension in upper extremities and equal hand grips bilaterally Equal 5/5 strength with hip flexion,knee flex/extension, foot dorsi/plantar flexion Cranial nerves 3/4/5/6/11/22/08/11/12 tested and intact No past pointing Sensation to light touch intact in all extremities EXTREMITIES: pulses normal, full ROM SKIN: warm,  color normal PSYCH: anxious   ED Course  Procedures   DIAGNOSTIC STUDIES: Oxygen Saturation is 100% on RA, normal by my interpretation.    COORDINATION OF CARE: 3:23 AM Discussed treatment plan with pt at  bedside and pt agreed to plan.  Pt well appearing He is ambulatory He has no CP He has no focal neuro deficits He reports feeling improved He felt this all stemmed from mismanagement of diabetes at home Currently, no signs of metabolic instability No signs CVA/ACS at this time  Labs Review Labs Reviewed  CBG MONITORING, ED - Abnormal; Notable for the following:    Glucose-Capillary 172 (*)    All other components within normal limits  I-STAT CHEM 8, ED - Abnormal; Notable for the following:    Glucose, Bld 147 (*)    All other components within normal limits     EKG Interpretation   Date/Time:  Tuesday Oct 01 2014 03:29:22 EDT Ventricular Rate:  66 PR Interval:  190 QRS Duration: 90 QT Interval:  402 QTC Calculation: 421 R Axis:   -4 Text Interpretation:  Sinus rhythm artifact noted No significant change  since last tracing Confirmed by Christy Gentles  MD, Elenore Rota ([id number]) on 10/01/2014  4:33:00 AM      MDM   Final diagnoses:  Hyperglycemia    Nursing notes including past medical history and social history reviewed and considered in documentation Labs/vital reviewed myself and considered during evaluation   I personally performed the services described in this documentation, which was scribed in my presence. The recorded information has been reviewed and is accurate.       Ruben Fraise, MD 10/01/14 [phone]

## 2014-10-01 NOTE — ED Notes (Addendum)
Patient with DM insulin dependent.  Patient does not have a glucometer and does not check his sugars on a regular basis.  Patient has been changed from his old insulin, to something different, patient not feeling like his sugars are okay.  Patient has had a headache and dizziness.  Patient states he felt sweaty, felt like his sugar was low, ate a sandwich at home.  He was frantic at home when EMS arrived.  CBG by EMS was 119.

## 2014-10-01 NOTE — Discharge Instructions (Signed)

## 2014-10-01 NOTE — ED Notes (Signed)
Patient given a cab voucher.

## 2014-10-14 ENCOUNTER — Emergency Department (HOSPITAL_COMMUNITY): Payer: Medicare Other

## 2014-10-14 ENCOUNTER — Encounter (HOSPITAL_COMMUNITY): Payer: Self-pay | Admitting: Emergency Medicine

## 2014-10-14 ENCOUNTER — Emergency Department (HOSPITAL_COMMUNITY)
Admission: EM | Admit: 2014-10-14 | Discharge: 2014-10-14 | Disposition: A | Discharge status: Home / Self Care | Payer: Medicare Other | Source: Home / Self Care | Attending: Emergency Medicine | Admitting: Emergency Medicine

## 2014-10-14 DIAGNOSIS — R627 Adult failure to thrive: Secondary | ICD-10-CM | POA: Diagnosis present

## 2014-10-14 DIAGNOSIS — E785 Hyperlipidemia, unspecified: Secondary | ICD-10-CM | POA: Diagnosis present

## 2014-10-14 DIAGNOSIS — E119 Type 2 diabetes mellitus without complications: Secondary | ICD-10-CM

## 2014-10-14 DIAGNOSIS — R296 Repeated falls: Secondary | ICD-10-CM | POA: Diagnosis present

## 2014-10-14 DIAGNOSIS — Z9119 Patient's noncompliance with other medical treatment and regimen: Secondary | ICD-10-CM | POA: Diagnosis present

## 2014-10-14 DIAGNOSIS — F419 Anxiety disorder, unspecified: Secondary | ICD-10-CM | POA: Diagnosis present

## 2014-10-14 DIAGNOSIS — F0390 Unspecified dementia without behavioral disturbance: Secondary | ICD-10-CM

## 2014-10-14 DIAGNOSIS — Z8673 Personal history of transient ischemic attack (TIA), and cerebral infarction without residual deficits: Secondary | ICD-10-CM

## 2014-10-14 DIAGNOSIS — Z8249 Family history of ischemic heart disease and other diseases of the circulatory system: Secondary | ICD-10-CM

## 2014-10-14 DIAGNOSIS — R410 Disorientation, unspecified: Secondary | ICD-10-CM

## 2014-10-14 DIAGNOSIS — F0151 Vascular dementia with behavioral disturbance: Secondary | ICD-10-CM | POA: Diagnosis present

## 2014-10-14 DIAGNOSIS — Z794 Long term (current) use of insulin: Secondary | ICD-10-CM

## 2014-10-14 DIAGNOSIS — I10 Essential (primary) hypertension: Secondary | ICD-10-CM | POA: Insufficient documentation

## 2014-10-14 DIAGNOSIS — D352 Benign neoplasm of pituitary gland: Secondary | ICD-10-CM | POA: Diagnosis not present

## 2014-10-14 DIAGNOSIS — Z7982 Long term (current) use of aspirin: Secondary | ICD-10-CM

## 2014-10-14 DIAGNOSIS — L259 Unspecified contact dermatitis, unspecified cause: Secondary | ICD-10-CM | POA: Diagnosis not present

## 2014-10-14 DIAGNOSIS — R42 Dizziness and giddiness: Secondary | ICD-10-CM | POA: Diagnosis not present

## 2014-10-14 DIAGNOSIS — L299 Pruritus, unspecified: Secondary | ICD-10-CM | POA: Diagnosis not present

## 2014-10-14 DIAGNOSIS — H5441 Blindness, right eye, normal vision left eye: Secondary | ICD-10-CM | POA: Diagnosis present

## 2014-10-14 DIAGNOSIS — R51 Headache: Secondary | ICD-10-CM | POA: Diagnosis present

## 2014-10-14 DIAGNOSIS — E1165 Type 2 diabetes mellitus with hyperglycemia: Secondary | ICD-10-CM | POA: Diagnosis present

## 2014-10-14 DIAGNOSIS — E1140 Type 2 diabetes mellitus with diabetic neuropathy, unspecified: Secondary | ICD-10-CM | POA: Diagnosis present

## 2014-10-14 DIAGNOSIS — I679 Cerebrovascular disease, unspecified: Secondary | ICD-10-CM | POA: Diagnosis present

## 2014-10-14 LAB — CBC WITH DIFFERENTIAL/PLATELET
Basophils Absolute: 0 10*3/uL (ref 0.0–0.1)
Basophils Relative: 1 % (ref 0–1)
EOS ABS: 0.1 10*3/uL (ref 0.0–0.7)
EOS PCT: 2 % (ref 0–5)
HCT: 42.6 % (ref 39.0–52.0)
Hemoglobin: 14.1 g/dL (ref 13.0–17.0)
Lymphocytes Relative: 24 % (ref 12–46)
Lymphs Abs: 1.1 10*3/uL (ref 0.7–4.0)
MCH: 30.9 pg (ref 26.0–34.0)
MCHC: 33.1 g/dL (ref 30.0–36.0)
MCV: 93.4 fL (ref 78.0–100.0)
MONOS PCT: 6 % (ref 3–12)
Monocytes Absolute: 0.3 10*3/uL (ref 0.1–1.0)
NEUTROS ABS: 3.2 10*3/uL (ref 1.7–7.7)
Neutrophils Relative %: 67 % (ref 43–77)
PLATELETS: 264 10*3/uL (ref 150–400)
RBC: 4.56 MIL/uL (ref 4.22–5.81)
RDW: 13.1 % (ref 11.5–15.5)
WBC: 4.7 10*3/uL (ref 4.0–10.5)

## 2014-10-14 LAB — COMPREHENSIVE METABOLIC PANEL
ALK PHOS: 88 U/L (ref 38–126)
ALT: 16 U/L — ABNORMAL LOW (ref 17–63)
AST: 15 U/L (ref 15–41)
Albumin: 4 g/dL (ref 3.5–5.0)
Anion gap: 8 (ref 5–15)
BILIRUBIN TOTAL: 0.7 mg/dL (ref 0.3–1.2)
BUN: 18 mg/dL (ref 6–20)
CALCIUM: 10.1 mg/dL (ref 8.9–10.3)
CO2: 26 mmol/L (ref 22–32)
CREATININE: 0.81 mg/dL (ref 0.61–1.24)
Chloride: 103 mmol/L (ref 101–111)
GFR calc Af Amer: >60 mL/min (ref >60)
Glucose, Bld: 290 mg/dL — ABNORMAL HIGH (ref 65–99)
Potassium: 4.2 mmol/L (ref 3.5–5.1)
SODIUM: 137 mmol/L (ref 135–145)
TOTAL PROTEIN: 7 g/dL (ref 6.5–8.1)

## 2014-10-14 LAB — URINALYSIS, ROUTINE W REFLEX MICROSCOPIC
BILIRUBIN URINE: NEGATIVE
HGB URINE DIPSTICK: NEGATIVE
Ketones, ur: NEGATIVE mg/dL
Leukocytes, UA: NEGATIVE
NITRITE: NEGATIVE
Protein, ur: NEGATIVE mg/dL
Specific Gravity, Urine: 1.024 (ref 1.005–1.030)
UROBILINOGEN UA: 1 mg/dL (ref 0.0–1.0)
pH: 7 (ref 5.0–8.0)

## 2014-10-14 LAB — URINE MICROSCOPIC-ADD ON

## 2014-10-14 LAB — CBG MONITORING, ED: GLUCOSE-CAPILLARY: 231 mg/dL — AB (ref 65–99)

## 2014-10-14 NOTE — Progress Notes (Signed)
CSW met with pt at bedside. Physician was present for some of the assessment. There was no family present.  Patient confirms that he comes from home. Patient states that he lives in [place] alone.  Patient informed CSW that he presents to [facility] due to home health agency staff being concerned for him. Patient states that while at home he felt himself becoming weak.   Patient states that he is a diabetic and needed his insulin. Patient states that he was waiting on his home health team to come and bring his medications. However, he states that he eventually went to go and look for them. Patient states he drove around to look for staff. However, it is unclear at this time where he actually drove himself to.  Patient informed CSW that when he returned home the police and EMS were present at his home. Patient states that they decided it was best that he come to [facility] to be evaluated medically.   Patient informed CSW that he does fall often at home. However, he says that he can complete his ADL's independently.  Patient states that he does not have a support system.  The pt states that he is not interested in a facility at this time. The pt stated " I want to be home. I own my home."   The pt accepted a list of facilities. CSW informed pt that it would be a good idea to go over the list of facilities with his home health agency.  The pt informed CSW that he was hungry. CSW consulted with nurse who states that it is okay for the pt to eat. CSW gave the pt a sandwich and a juice.  Patient states that he does not have any questions at this time.   Willette Brace [phone] ED CSW 10/14/2014 6:31 PM

## 2014-10-14 NOTE — ED Notes (Signed)
Pt verbalized understanding of discharge instructions including follow up, reasons to return to the ED, and continued medications to take at home. Pt ambulated to w/c with cane, moving all extremities well, and was escorted to exit to friend awaiting him. Pt denies questions, pain or further need upon discharge.

## 2014-10-14 NOTE — ED Notes (Signed)
Bed: BD53 Expected date:  Expected time:  Means of arrival:  Comments: wandering

## 2014-10-14 NOTE — Discharge Instructions (Signed)
Please read and follow all provided instructions.  Your diagnoses today include:  1. Dementia   2. Confusion     Tests performed today include:  CT of head - no new stroke  Blood counts and electrolytes - high blood sugar  Urine test - no infection  Vital signs. See below for your results today.   Medications prescribed:   None  Take any prescribed medications only as directed.  Home care instructions:  Follow any educational materials contained in this packet.  BE VERY CAREFUL not to take multiple medicines containing Tylenol (also called acetaminophen). Doing so can lead to an overdose which can damage your liver and cause liver failure and possibly death.   Follow-up instructions: Please follow-up with your primary care provider in the next 3 days for further evaluation of your symptoms. They can also assist with placement into an assisted living facility.   Return instructions:   Please return to the Emergency Department if you experience worsening symptoms.   Please return if you have any other emergent concerns.  Additional Information:  Your vital signs today were: BP 140/83 mmHg   Pulse 77   Temp(Src) 99 F (37.2 C) (Oral)   Resp 16   SpO2 100% If your blood pressure (BP) was elevated above 135/85 this visit, please have this repeated by your doctor within one month. --------------

## 2014-10-14 NOTE — Progress Notes (Signed)
pcp is [facility] [address] [place], [place] [address]  [phone]

## 2014-10-14 NOTE — ED Notes (Signed)
Per EMS-from home (lives alone)-called out by PD for welfare check. Home health nurse called patient his morning - then came to house for nursing care. When nurse arrived he was not answering the door. He drives up in his car a little while later. VS: BP 160/80 CBG 320 mg/dl. Hasn't had normal PO medications the last few days. Unknown where patient drove. There has been an attempt by home health care to place patient in nursing home (some issues over patient's state). Alert to situation. Hx dementia.

## 2014-10-14 NOTE — ED Provider Notes (Signed)
6:00 PM Handoff from [facility] at shift change. Pending completion of head CT to complete evaluation of patient. Here with mild dementia. CT head was negative.  Patient has been seen here by social work. He was seen on his previous visit as well. Patient has been maximized on home health assistance from ED standpoint. He has been provided with assisted living referrals today. Patient states that he will follow up on these. Patient with a friend at the bedside currently.  Patient ready for discharge to home at this time. He is encouraged to follow-up with his PCP for assistance as well. Return with any concerns. Patient appears well, nontoxic.  Exam:  Gen NAD; Heart RRR, nml S1,S2, no m/r/g; Lungs CTAB; Abd soft, NT, no rebound or guarding; Ext 2+ pedal pulses bilaterally, no edema.    Carlisle Cater, PA-C 10/14/14 2000  Debby Freiberg, MD 10/19/14 [phone]

## 2014-10-14 NOTE — ED Provider Notes (Signed)
CSN: [id number]     Arrival date & time 10/14/14  1140 History   First MD Initiated Contact with Patient 10/14/14 1149     Chief Complaint  Patient presents with  . Dementia     (Consider location/radiation/quality/duration/timing/severity/associated sxs/prior Treatment) HPI Ruben Williams is a 69 year old male with past medical history of diabetes, hypertension, CVA who presents the ER, brought by EMS. Patient has home health nursing to come to patient's house on a frequent basis throughout the week. Home health nurse arrived for regular visit after calling patient, and patient was not present at his house. EMS and police were called who forced entry and the patient's house and did not find patient in the house. As they're on scene they noted patient to drive up in his car. EMS and home health nurses were concerned about patient's well-being and brought patient to the ER for further evaluation. Patient states that he felt that the nurse running late, and drove out to find her, and did not find her and return back to home. Patient denies having any specific complaints to me.  Past Medical History  Diagnosis Date  . Diabetes mellitus   . Hypertension   . Stroke    Past Surgical History  Procedure Laterality Date  . Knee arthroscopy     History reviewed. No pertinent family history. History  Substance Use Topics  . Smoking status: Never Smoker   . Smokeless tobacco: Not on file  . Alcohol Use: No    Review of Systems  Constitutional: Negative for fever.  HENT: Negative for trouble swallowing.   Eyes: Negative for visual disturbance.  Respiratory: Negative for shortness of breath.   Cardiovascular: Negative for chest pain.  Gastrointestinal: Negative for nausea, vomiting and abdominal pain.  Genitourinary: Negative for dysuria.  Musculoskeletal: Negative for neck pain.  Skin: Negative for rash.  Neurological: Negative for dizziness, weakness and numbness.  Psychiatric/Behavioral:  Negative.       Allergies  Review of patient's allergies indicates no known allergies.  Home Medications   Prior to Admission medications   Medication Sig Start Date End Date Taking? Authorizing Provider  aspirin EC 81 MG tablet Take 162 mg by mouth daily.   Yes Historical Provider, MD  insulin aspart protamine-insulin aspart (NOVOLOG 70/30) (70-30) 100 UNIT/ML injection Inject 12 Units into the skin 3 (three) times daily.   Yes Historical Provider, MD   BP 140/83 mmHg  Pulse 77  Temp(Src) 99 F (37.2 C) (Oral)  Resp 16  SpO2 100% Physical Exam  Constitutional: He is oriented to person, place, and time. He appears well-developed and well-nourished. No distress.  HENT:  Head: Normocephalic and atraumatic.  Mouth/Throat: Oropharynx is clear and moist. No oropharyngeal exudate.  Eyes: Right eye exhibits no discharge. Left eye exhibits no discharge. No scleral icterus.  Neck: Normal range of motion.  Cardiovascular: Normal rate, regular rhythm and normal heart sounds.   No murmur heard. Pulmonary/Chest: Effort normal and breath sounds normal. No respiratory distress.  Abdominal: Soft. There is no tenderness.  Musculoskeletal: Normal range of motion. He exhibits no edema or tenderness.  Neurological: He is alert and oriented to person, place, and time. He has normal strength. No cranial nerve deficit or sensory deficit. Coordination normal. GCS eye subscore is 4. GCS verbal subscore is 5. GCS motor subscore is 6.  Patient fully alert, answering questions appropriately in full, clear sentences. Cranial nerves II through XII grossly intact. Motor strength 5 out of 5 in all major  muscle groups of upper and lower extremities. Distal sensation intact.   Skin: Skin is warm and dry. No rash noted. He is not diaphoretic.  Psychiatric: He has a normal mood and affect.  Nursing note and vitals reviewed.   ED Course  Procedures (including critical care time) Labs Review Labs Reviewed   COMPREHENSIVE METABOLIC PANEL - Abnormal; Notable for the following:    Glucose, Bld 290 (*)    ALT 16 (*)    All other components within normal limits  URINALYSIS, ROUTINE W REFLEX MICROSCOPIC (NOT AT [facility]) - Abnormal; Notable for the following:    Glucose, UA >1000 (*)    All other components within normal limits  CBG MONITORING, ED - Abnormal; Notable for the following:    Glucose-Capillary 231 (*)    All other components within normal limits  CBC WITH DIFFERENTIAL/PLATELET  URINE MICROSCOPIC-ADD ON  Randolm Idol, ED    Imaging Review Dg Chest 2 View  10/14/2014   CLINICAL DATA:  Cough for 1 week  EXAM: CHEST  2 VIEW  COMPARISON:  09/25/2014  FINDINGS: The heart size and mediastinal contours are within normal limits. Both lungs are clear. The visualized skeletal structures are unremarkable.  IMPRESSION: No active cardiopulmonary disease.   Electronically Signed   By: Skipper Cliche M.D.   On: 10/14/2014 13:49     EKG Interpretation   Date/Time:  Monday Oct 14 2014 13:20:26 EDT Ventricular Rate:  73 PR Interval:  174 QRS Duration: 84 QT Interval:  386 QTC Calculation: 425 R Axis:   -33 Text Interpretation:  Sinus rhythm Left axis deviation Confirmed by DELO   MD, DOUGLAS ([id number]) on 10/14/2014 2:46:01 PM      MDM   Final diagnoses:  Dementia  Confusion    Patient is here after being brought by EMS, being sent by home health care because patient is having difficulty taking care of himself. From our standpoint in the ED, patient has no medical complaints, exam is benign, neurologic exam is benign. Patient's labs and workup here are overall unremarkable for acute pathology. CT of patient's head is pending. Social worker and case management has spoken to patient, social worker has increased patient's home health care on his last visit several weeks ago. Did advise they are unable to increase anymore or further care for patient, however his care through [facility] would be  able to increase or supplement his care as needed. Patient spoken to by case manager about placement in assisted living, which patient is opposed to at this time. Patient does not have any suicidal or homicidal ideation. Patient is well-appearing, hemodynamically stable, afebrile and in no acute distress. There is no evidence of organic cause of patient's signs and symptoms which are likely attributed to mild dementia. With patient adamantly refusing going to any facility this time, we'll discharge patient home into care of home health care to increase care as needed.  Patient signed out to Carlisle Cater, PA-C with plan on following up with results of patient's head CT without contrast for rule out of acute CVA.  Signed,  Dahlia Bailiff, PA-C 5:50 PM  Patient seen and discussed with Dr. Debby Freiberg, M.D.     Dahlia Bailiff, PA-C 10/15/14 1449  Debby Freiberg, MD 10/19/14 [place]

## 2014-10-14 NOTE — ED Notes (Addendum)
MD and social work at bedside.

## 2014-10-14 NOTE — ED Notes (Signed)
PA at bedside.

## 2014-10-15 ENCOUNTER — Encounter (HOSPITAL_COMMUNITY): Payer: Self-pay | Admitting: Oncology

## 2014-10-15 ENCOUNTER — Observation Stay (HOSPITAL_COMMUNITY): Payer: Medicare Other

## 2014-10-15 ENCOUNTER — Inpatient Hospital Stay (HOSPITAL_COMMUNITY)
Admission: EM | Admit: 2014-10-15 | Discharge: 2014-10-25 | DRG: 644 | Disposition: A | Discharge status: Skilled Nursing Facility | Payer: Medicare Other | Attending: Internal Medicine | Admitting: Internal Medicine

## 2014-10-15 DIAGNOSIS — E1149 Type 2 diabetes mellitus with other diabetic neurological complication: Secondary | ICD-10-CM | POA: Diagnosis present

## 2014-10-15 DIAGNOSIS — I679 Cerebrovascular disease, unspecified: Secondary | ICD-10-CM | POA: Diagnosis present

## 2014-10-15 DIAGNOSIS — D352 Benign neoplasm of pituitary gland: Secondary | ICD-10-CM | POA: Diagnosis present

## 2014-10-15 DIAGNOSIS — F0391 Unspecified dementia with behavioral disturbance: Secondary | ICD-10-CM | POA: Diagnosis not present

## 2014-10-15 DIAGNOSIS — F0151 Vascular dementia with behavioral disturbance: Secondary | ICD-10-CM | POA: Diagnosis present

## 2014-10-15 DIAGNOSIS — L259 Unspecified contact dermatitis, unspecified cause: Secondary | ICD-10-CM | POA: Diagnosis not present

## 2014-10-15 DIAGNOSIS — H5441 Blindness, right eye, normal vision left eye: Secondary | ICD-10-CM | POA: Diagnosis present

## 2014-10-15 DIAGNOSIS — F419 Anxiety disorder, unspecified: Secondary | ICD-10-CM | POA: Diagnosis present

## 2014-10-15 DIAGNOSIS — Z8673 Personal history of transient ischemic attack (TIA), and cerebral infarction without residual deficits: Secondary | ICD-10-CM | POA: Diagnosis not present

## 2014-10-15 DIAGNOSIS — L299 Pruritus, unspecified: Secondary | ICD-10-CM | POA: Diagnosis not present

## 2014-10-15 DIAGNOSIS — F03918 Unspecified dementia, unspecified severity, with other behavioral disturbance: Secondary | ICD-10-CM | POA: Diagnosis present

## 2014-10-15 DIAGNOSIS — E1165 Type 2 diabetes mellitus with hyperglycemia: Secondary | ICD-10-CM

## 2014-10-15 DIAGNOSIS — F22 Delusional disorders: Secondary | ICD-10-CM | POA: Diagnosis present

## 2014-10-15 DIAGNOSIS — I10 Essential (primary) hypertension: Secondary | ICD-10-CM | POA: Diagnosis present

## 2014-10-15 DIAGNOSIS — R451 Restlessness and agitation: Secondary | ICD-10-CM | POA: Diagnosis present

## 2014-10-15 DIAGNOSIS — F0390 Unspecified dementia without behavioral disturbance: Secondary | ICD-10-CM | POA: Diagnosis present

## 2014-10-15 DIAGNOSIS — Z7982 Long term (current) use of aspirin: Secondary | ICD-10-CM | POA: Diagnosis not present

## 2014-10-15 DIAGNOSIS — R531 Weakness: Secondary | ICD-10-CM | POA: Diagnosis not present

## 2014-10-15 DIAGNOSIS — Z794 Long term (current) use of insulin: Secondary | ICD-10-CM | POA: Diagnosis not present

## 2014-10-15 DIAGNOSIS — R51 Headache: Secondary | ICD-10-CM | POA: Diagnosis present

## 2014-10-15 DIAGNOSIS — R42 Dizziness and giddiness: Secondary | ICD-10-CM | POA: Diagnosis present

## 2014-10-15 DIAGNOSIS — Z9119 Patient's noncompliance with other medical treatment and regimen: Secondary | ICD-10-CM | POA: Diagnosis present

## 2014-10-15 DIAGNOSIS — R627 Adult failure to thrive: Secondary | ICD-10-CM | POA: Diagnosis present

## 2014-10-15 DIAGNOSIS — E785 Hyperlipidemia, unspecified: Secondary | ICD-10-CM | POA: Diagnosis present

## 2014-10-15 DIAGNOSIS — E1140 Type 2 diabetes mellitus with diabetic neuropathy, unspecified: Secondary | ICD-10-CM | POA: Diagnosis present

## 2014-10-15 DIAGNOSIS — Z8249 Family history of ischemic heart disease and other diseases of the circulatory system: Secondary | ICD-10-CM | POA: Diagnosis not present

## 2014-10-15 DIAGNOSIS — R296 Repeated falls: Secondary | ICD-10-CM | POA: Diagnosis present

## 2014-10-15 HISTORY — DX: Essential (primary) hypertension: I10

## 2014-10-15 HISTORY — DX: Hyperlipidemia, unspecified: E78.5

## 2014-10-15 HISTORY — DX: Benign neoplasm of pituitary gland: D35.2

## 2014-10-15 HISTORY — DX: Type 2 diabetes mellitus with other diabetic neurological complication: E11.49

## 2014-10-15 HISTORY — DX: Unspecified dementia without behavioral disturbance: F03.90

## 2014-10-15 LAB — CBC
HEMATOCRIT: 40.2 % (ref 39.0–52.0)
HEMOGLOBIN: 13.1 g/dL (ref 13.0–17.0)
MCH: 30 pg (ref 26.0–34.0)
MCHC: 32.6 g/dL (ref 30.0–36.0)
MCV: 92.2 fL (ref 78.0–100.0)
Platelets: 238 10*3/uL (ref 150–400)
RBC: 4.36 MIL/uL (ref 4.22–5.81)
RDW: 13.1 % (ref 11.5–15.5)
WBC: 6.4 10*3/uL (ref 4.0–10.5)

## 2014-10-15 LAB — VITAMIN B12: VITAMIN B 12: 401 pg/mL (ref 180–914)

## 2014-10-15 LAB — GLUCOSE, CAPILLARY
GLUCOSE-CAPILLARY: 214 mg/dL — AB (ref 65–99)
Glucose-Capillary: 196 mg/dL — ABNORMAL HIGH (ref 65–99)
Glucose-Capillary: 219 mg/dL — ABNORMAL HIGH (ref 65–99)
Glucose-Capillary: 233 mg/dL — ABNORMAL HIGH (ref 65–99)

## 2014-10-15 LAB — BASIC METABOLIC PANEL
ANION GAP: 10 (ref 5–15)
BUN: 19 mg/dL (ref 6–20)
CHLORIDE: 101 mmol/L (ref 101–111)
CO2: 23 mmol/L (ref 22–32)
CREATININE: 1.22 mg/dL (ref 0.61–1.24)
Calcium: 10 mg/dL (ref 8.9–10.3)
GFR calc Af Amer: >60 mL/min (ref >60)
GFR calc non Af Amer: 59 mL/min — ABNORMAL LOW (ref >60)
Glucose, Bld: 405 mg/dL — ABNORMAL HIGH (ref 65–99)
Potassium: 4.2 mmol/L (ref 3.5–5.1)
Sodium: 134 mmol/L — ABNORMAL LOW (ref 135–145)

## 2014-10-15 LAB — CREATININE, SERUM
CREATININE: 0.78 mg/dL (ref 0.61–1.24)
GFR calc Af Amer: >60 mL/min (ref >60)
GFR calc non Af Amer: >60 mL/min (ref >60)

## 2014-10-15 LAB — CBC WITH DIFFERENTIAL/PLATELET
BASOS ABS: 0 10*3/uL (ref 0.0–0.1)
Basophils Relative: 1 % (ref 0–1)
EOS ABS: 0.1 10*3/uL (ref 0.0–0.7)
EOS PCT: 2 % (ref 0–5)
HCT: 41.6 % (ref 39.0–52.0)
HEMOGLOBIN: 14 g/dL (ref 13.0–17.0)
LYMPHS ABS: 0.8 10*3/uL (ref 0.7–4.0)
Lymphocytes Relative: 15 % (ref 12–46)
MCH: 30.9 pg (ref 26.0–34.0)
MCHC: 33.7 g/dL (ref 30.0–36.0)
MCV: 91.8 fL (ref 78.0–100.0)
Monocytes Absolute: 0.3 10*3/uL (ref 0.1–1.0)
Monocytes Relative: 6 % (ref 3–12)
Neutro Abs: 3.9 10*3/uL (ref 1.7–7.7)
Neutrophils Relative %: 76 % (ref 43–77)
PLATELETS: 260 10*3/uL (ref 150–400)
RBC: 4.53 MIL/uL (ref 4.22–5.81)
RDW: 13.1 % (ref 11.5–15.5)
WBC: 5.1 10*3/uL (ref 4.0–10.5)

## 2014-10-15 LAB — TSH: TSH: 2.553 u[IU]/mL (ref 0.350–4.500)

## 2014-10-15 LAB — CBG MONITORING, ED: Glucose-Capillary: 425 mg/dL — ABNORMAL HIGH (ref 65–99)

## 2014-10-15 LAB — T4, FREE: FREE T4: 1.04 ng/dL (ref 0.61–1.12)

## 2014-10-15 MED ORDER — INSULIN ASPART 100 UNIT/ML ~~LOC~~ SOLN
0.0000 [IU] | Freq: Three times a day (TID) | SUBCUTANEOUS | Status: DC
  Administered 2014-10-15 – 2014-10-16 (×3): 5 [IU] via SUBCUTANEOUS
  Administered 2014-10-16: 8 [IU] via SUBCUTANEOUS

## 2014-10-15 MED ORDER — GADOBENATE DIMEGLUMINE 529 MG/ML IV SOLN
12.0000 mL | Freq: Once | INTRAVENOUS | Status: AC | PRN
  Administered 2014-10-15: 12 mL via INTRAVENOUS

## 2014-10-15 MED ORDER — METOCLOPRAMIDE HCL 5 MG/ML IJ SOLN
10.0000 mg | INTRAMUSCULAR | Status: AC
  Administered 2014-10-15: 10 mg via INTRAVENOUS
  Filled 2014-10-15: qty 2

## 2014-10-15 MED ORDER — SODIUM CHLORIDE 0.9 % IV SOLN
INTRAVENOUS | Status: DC
  Administered 2014-10-15 – 2014-10-16 (×2): via INTRAVENOUS

## 2014-10-15 MED ORDER — SODIUM CHLORIDE 0.9 % IV BOLUS (SEPSIS)
1000.0000 mL | Freq: Once | INTRAVENOUS | Status: AC
  Administered 2014-10-15: 1000 mL via INTRAVENOUS

## 2014-10-15 MED ORDER — INSULIN ASPART 100 UNIT/ML ~~LOC~~ SOLN
8.0000 [IU] | Freq: Once | SUBCUTANEOUS | Status: AC
  Administered 2014-10-15: 8 [IU] via INTRAVENOUS
  Filled 2014-10-15: qty 1

## 2014-10-15 MED ORDER — ENOXAPARIN SODIUM 40 MG/0.4ML ~~LOC~~ SOLN
40.0000 mg | SUBCUTANEOUS | Status: DC
  Administered 2014-10-15 – 2014-10-24 (×10): 40 mg via SUBCUTANEOUS
  Filled 2014-10-15 (×13): qty 0.4

## 2014-10-15 NOTE — ED Notes (Signed)
Pt was seen here 5/30, released at 2000.  Per EMS pt presents d/t nausea and feeling dizzy while walking.  Pt denies syncope.  Pt is hyperglycemic.

## 2014-10-15 NOTE — Progress Notes (Signed)
Inpatient Diabetes Program Recommendations  AACE/ADA: New Consensus Statement on Inpatient Glycemic Control (2013)  Target Ranges:  Prepandial:   less than 140 mg/dL      Peak postprandial:   less than 180 mg/dL (1-2 hours)      Critically ill patients:  140 - 180 mg/dL   Reason for Assessment: Hyperglycemia  Diabetes history: DM2 Outpatient Diabetes medications: 70/30 12 units tidwc Current orders for Inpatient glycemic control: Novolog 8 units (1x)  Needs basal / bolus insulin.  Inpatient Diabetes Program Recommendations Insulin - Basal: Add 70/30 12 units tidwc Correction (SSI): Add Novolog resistant tidwc and hs HgbA1C: Check HgbA1C to assess glycemic control prior to hospitalization  (ordered)  Will follow while inpatient. Will need prescription for meter and supplies at discharge.  Thank you. Lorenda Peck, RD, LDN, CDE Inpatient Diabetes Coordinator [phone]

## 2014-10-15 NOTE — Care Management Note (Signed)
Case Management Note  Patient Details  Name: [name] MRN: [id number] Date of Birth: 1945-12-22  Subjective/Objective: 69 y/o m admitted w/falls, Pituitary adenoma.XI:[id number], stroke.From home. Noted has [place] [phone](contact for dme needs)                  Action/Plan:d/c plan home.   Expected Discharge Date:   (unknown)               Expected Discharge Plan:  Home/Self Care (Await PT recommendations.)  In-House Referral:     Discharge planning Services  CM Consult  Post Acute Care Choice:    Choice offered to:     DME Arranged:    DME Agency:     HH Arranged:    HH Agency:     Status of Service:  In process, will continue to follow  Medicare Important Message Given:    Date Medicare IM Given:    Medicare IM give by:    Date Additional Medicare IM Given:    Additional Medicare Important Message give by:     If discussed at [place] of Stay Meetings, dates discussed:    Additional Comments:  Dessa Phi, RN 10/15/2014, 12:34 PM

## 2014-10-15 NOTE — ED Provider Notes (Signed)
CSN: [id number]     Arrival date & time 10/15/14  0346 History   First MD Initiated Contact with Patient 10/15/14 0350     Chief Complaint  Patient presents with  . Nausea  . Dizziness     (Consider location/radiation/quality/duration/timing/severity/associated sxs/prior Treatment) HPI Comments: Patient is a 69 year old male with a history of diabetes mellitus, hypertension, CVA, and dementia. He was discharged from the emergency department at 2000 yesterday. He returns to the ED this evening via EMS. Patient reports that he went to [employer] after hospital discharge where he ate steak with gravy and sweet potatoes. He states that he began to feel nauseous and dizzy later in the evening. He reports stumbling while at home on his way to the bathroom, but denies any falls, head trauma, or syncope. He denies taking any insulin after eating this evening. He reports that he was told not to take any insulin tonight because he had not had anything to eat at the emergency department yesterday afternoon. Patient reports one episode of emesis just following arrival. He has some mild nausea at this time. Patient is a poor historian. He seems to get easily confused. He states, "I believe I had some shortness of breath when I was trying to get the bathroom". He denies any shortness of breath or chest pain at this time. No extremity numbness or weakness. No fevers.  Patient is a 69 y.o. male presenting with dizziness. The history is provided by the patient. No language interpreter was used.  Dizziness Associated symptoms: nausea, shortness of breath and vomiting   Associated symptoms: no chest pain and no weakness     Past Medical History  Diagnosis Date  . Diabetes mellitus   . Hypertension   . Stroke    Past Surgical History  Procedure Laterality Date  . Knee arthroscopy     History reviewed. No pertinent family history. History  Substance Use Topics  . Smoking status: Never Smoker   .  Smokeless tobacco: Not on file  . Alcohol Use: No    Review of Systems  Constitutional: Negative for fever.  Respiratory: Positive for shortness of breath.   Cardiovascular: Negative for chest pain.  Gastrointestinal: Positive for nausea and vomiting. Negative for abdominal pain.  Neurological: Positive for dizziness. Negative for syncope, weakness and numbness.  All other systems reviewed and are negative.   Allergies  Review of patient's allergies indicates no known allergies.  Home Medications   Prior to Admission medications   Medication Sig Start Date End Date Taking? Authorizing Provider  aspirin EC 81 MG tablet Take 162 mg by mouth daily.   Yes Historical Provider, MD  insulin aspart protamine-insulin aspart (NOVOLOG 70/30) (70-30) 100 UNIT/ML injection Inject 12 Units into the skin 3 (three) times daily.   Yes Historical Provider, MD   BP 137/74 mmHg  Pulse 80  Temp(Src) 97.8 F (36.6 C) (Oral)  Resp 20  SpO2 100% Physical Exam  Constitutional: He is oriented to person, place, and time. He appears well-developed and well-nourished. No distress.  HENT:  Head: Normocephalic and atraumatic.  Eyes: Conjunctivae and EOM are normal. No scleral icterus.  Neck: Normal range of motion.  Cardiovascular: Normal rate, regular rhythm and intact distal pulses.   Pulmonary/Chest: Effort normal and breath sounds normal. No respiratory distress. He has no wheezes. He has no rales.  Respirations even and unlabored. Lungs clear bilaterally.  Abdominal: Soft. He exhibits no distension. There is no tenderness. There is no rebound and  no guarding.  Soft, nontender abdomen. No masses or peritoneal signs noted.  Musculoskeletal: Normal range of motion.  Neurological: He is alert and oriented to person, place, and time. He has normal strength. No cranial nerve deficit or sensory deficit. He exhibits normal muscle tone. Coordination normal. GCS eye subscore is 4. GCS verbal subscore is 5. GCS  motor subscore is 6.  GCS 15. Speech is goal oriented. Patient answers questions appropriately and follows simple commands. No focal neurologic deficits appreciated. Patient moves extremities without ataxia.  Skin: Skin is warm and dry. No rash noted. He is not diaphoretic. No erythema. No pallor.  Psychiatric: He has a normal mood and affect. His behavior is normal.  Nursing note and vitals reviewed.   ED Course  Procedures (including critical care time) Labs Review Labs Reviewed  CBG MONITORING, ED - Abnormal; Notable for the following:    Glucose-Capillary 425 (*)    All other components within normal limits  CBC WITH DIFFERENTIAL/PLATELET  BASIC METABOLIC PANEL    Imaging Review Dg Chest 2 View  10/14/2014   CLINICAL DATA:  Cough for 1 week  EXAM: CHEST  2 VIEW  COMPARISON:  09/25/2014  FINDINGS: The heart size and mediastinal contours are within normal limits. Both lungs are clear. The visualized skeletal structures are unremarkable.  IMPRESSION: No active cardiopulmonary disease.   Electronically Signed   By: Skipper Cliche M.D.   On: 10/14/2014 13:49   Ct Head Wo Contrast  10/14/2014   CLINICAL DATA:  Acute onset of confusion. Hyperglycemia and high blood pressure. Initial encounter.  EXAM: CT HEAD WITHOUT CONTRAST  TECHNIQUE: Contiguous axial images were obtained from the base of the skull through the vertex without intravenous contrast.  COMPARISON:  CT of the head performed 09/25/2014  FINDINGS: There is no evidence of acute infarction, mass lesion, or intra- or extra-axial hemorrhage on CT.  Prominence of the ventricles and sulci reflects moderate cortical volume loss. Mild cerebellar atrophy is noted. Scattered periventricular white matter change likely reflects small vessel ischemic microangiopathy.  The brainstem and fourth ventricle are within normal limits. The basal ganglia are unremarkable in appearance. The cerebral hemispheres demonstrate grossly normal gray-white  differentiation. No mass effect or midline shift is seen.  There is no evidence of fracture; visualized osseous structures are unremarkable in appearance. The orbits are within normal limits. The paranasal sinuses and mastoid air cells are well-aerated. No significant soft tissue abnormalities are seen.  IMPRESSION: 1. No acute intracranial pathology seen on CT. 2. Moderate cortical volume loss and scattered small vessel ischemic microangiopathy.   Electronically Signed   By: Garald Balding M.D.   On: 10/14/2014 18:40     EKG Interpretation None      MDM   Final diagnoses:  Dizziness  Hyperglycemia due to type 2 diabetes mellitus    69 year old male presents to the emergency department 8 hours following discharge from the ED with new complaints of dizziness and nausea. Patient with 1 episode of vomiting prior to arrival. He had no complaints of dizziness for nausea during his prior ED encounter. Patient lives in a home by himself. He does have a nurse who comes to the house to help him with his ADLs.  Neurologic exam today is nonfocal. Patient is afebrile and hemodynamically stable. He is not orthostatic. His labs show hyperglycemia without metabolic acidosis. This is likely secondary to noncompliance with his insulin yesterday following discharge.  Patient had a very complete workup in the emergency department  prior to presentation this evening. He had a negative head CT at this time as well as a negative chest x-ray. Patient with a history of CVA. Question whether dizziness is secondary to CVA/TIA. Patient remains too dizzy to ambulate while in the emergency department. Plan includes observation admission for MRI in the morning. Hyperglycemia managed in ED with insulin and IVF.  Case discussed with Dr. Hal Hope of [facility] who will admit. Temp orders for observation admission placed.   Filed Vitals:   10/15/14 0346 10/15/14 0355 10/15/14 0500 10/15/14 0530  BP:  137/74 130/69 148/73   Pulse:  80 69 70  Temp:  97.8 F (36.6 C)    TempSrc:  Oral    Resp:  20 14 15   SpO2: 100% 100%       Antonietta Breach, PA-C 10/15/14 6712  Pamella Pert, MD 10/15/14 [phone]

## 2014-10-15 NOTE — Consult Note (Signed)
NEURO HOSPITALIST CONSULT NOTE    Reason for Consult: falls, pituitary adenoma  HPI:                                                                                                                                          Ruben Williams is an 69 y.o. male with a past medical history significant for HTN, DM, stroke that reportedly affected is left side, dementia, and recurrent falls, blindness right eye, brought in with complains of dizziness, falls, unsteadiness. 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 Patient is unquestionably a poor historian, but he indicated that he has been off balance and falling for over a year. He denies experiencing true vertigo or lightheadedness before falling, no legs weakness, or impairment of consciousness. Stated that " I just get off balance, leaning from side to side". Complains of sporadic HA but denies focal weakness or numbness, slurred speech, or language impairment. He is blind in the right eye but denies visual impairment left eye.  Complains of seeing " people talking but they are not really there". No breast enlargement or galactorrhea. Ruben Williams said that his short term memory is progressively declining.     Past Medical History  Diagnosis Date  . Diabetes mellitus   . Hypertension   . Stroke     Past Surgical History  Procedure Laterality Date  . Knee arthroscopy    . Joint replacement      left knee  . Left and right knee arthroscopy    . Tonsillectomy    . Laser surgery to right eye      Family History  Problem Relation Age of Onset  . Hypertension Father     Family History: no epilepsy, brain tumors, or brain aneurysms.    Social History:  reports that he has never smoked. He has never used smokeless tobacco. He reports that he does not drink alcohol or use illicit drugs.  No Known  Allergies  MEDICATIONS:  Scheduled: . enoxaparin (LOVENOX) injection  40 mg Subcutaneous Q24H  . insulin aspart  0-15 Units Subcutaneous TID WC     ROS:                                                                                                                                       History obtained from chart review and the patient  General ROS: negative for - chills, fatigue, fever, night sweats, weight gain or weight loss Psychological ROS: negative for - behavioral disorder, memory difficulties, mood swings or suicidal ideation Ophthalmic ROS: negative for - double vision or eye pain ENT ROS: negative for - epistaxis, nasal discharge, oral lesions, sore throat, tinnitus or vertigo Allergy and Immunology ROS: negative for - hives or itchy/watery eyes Hematological and Lymphatic ROS: negative for - bleeding problems, bruising or swollen lymph nodes Endocrine ROS: negative for - galactorrhea, hair pattern changes, polydipsia/polyuria or temperature intolerance Respiratory ROS: negative for - cough, hemoptysis, shortness of breath or wheezing Cardiovascular ROS: negative for - chest pain, dyspnea on exertion, edema or irregular heartbeat Gastrointestinal ROS: negative for - abdominal pain, diarrhea, hematemesis, nausea/vomiting or stool incontinence Genito-Urinary ROS: negative for - hematuria, incontinence or urinary frequency/urgency.  Musculoskeletal ROS: negative for - joint swelling or muscular weakness Neurological ROS: as noted in HPI Dermatological ROS: negative for rash and skin lesion changes   Physical exam: pleasant male in no apparent distress. Blood pressure 157/81, pulse 65, temperature 98.4 F (36.9 C), temperature source Oral, resp. rate 20, height 6' 1"  (1.854 m), weight 85.9 kg (189 lb 6 oz), SpO2 100 %. Head: normocephalic. Neck: supple, no bruits,  no JVD. Cardiac: no murmurs. Lungs: clear. Abdomen: soft, no tender, no mass. Extremities: no edema. Skin: no rash  Neurologic Examination:                                                                                                      General: Mental Status: Alert, oriented only to month,  Doesn't know president's name, recalls 1/3 objects at 5 min, can not perform serial 7/s.  Speech fluent without evidence of aphasia.  Able to follow 3 step commands without difficulty. Cranial Nerves: II: Discs flat bilaterally; Visual fields grossly normal, pupils equal, round, reactive to light and accommodation III,IV, VI: ptosis not present, extra-ocular motions intact bilaterally V,VII: smile symmetric, facial light touch sensation normal bilaterally VIII: hearing normal bilaterally IX,X: uvula rises symmetrically XI: bilateral shoulder shrug XII: midline tongue extension without atrophy  or fasciculations  Motor: Right : Upper extremity   5/5    Left:     Upper extremity   5/5  Lower extremity   5/5     Lower extremity   5/5 Tone and bulk:normal tone throughout; no atrophy noted Sensory: Pinprick and light touch intact throughout, bilaterally Deep Tendon Reflexes:  1+ all over Plantars: Right: downgoing   Left: downgoing Cerebellar: normal finger-to-nose,  normal heel-to-shin test Gait:  No tested due to multiple leads  No results found for: CHOL  Results for orders placed or performed during the hospital encounter of 10/15/14 (from the past 48 hour(s))  CBG monitoring, ED     Status: Abnormal   Collection Time: 10/15/14  4:07 AM  Result Value Ref Range   Glucose-Capillary 425 (H) 65 - 99 mg/dL  CBC with Differential     Status: None   Collection Time: 10/15/14  4:26 AM  Result Value Ref Range   WBC 5.1 4.0 - 10.5 K/uL   RBC 4.53 4.22 - 5.81 MIL/uL   Hemoglobin 14.0 13.0 - 17.0 g/dL   HCT 41.6 39.0 - 52.0 %   MCV 91.8 78.0 - 100.0 fL   MCH 30.9 26.0 - 34.0 pg   MCHC 33.7  30.0 - 36.0 g/dL   RDW 13.1 11.5 - 15.5 %   Platelets 260 150 - 400 K/uL   Neutrophils Relative % 76 43 - 77 %   Neutro Abs 3.9 1.7 - 7.7 K/uL   Lymphocytes Relative 15 12 - 46 %   Lymphs Abs 0.8 0.7 - 4.0 K/uL   Monocytes Relative 6 3 - 12 %   Monocytes Absolute 0.3 0.1 - 1.0 K/uL   Eosinophils Relative 2 0 - 5 %   Eosinophils Absolute 0.1 0.0 - 0.7 K/uL   Basophils Relative 1 0 - 1 %   Basophils Absolute 0.0 0.0 - 0.1 K/uL  Basic metabolic panel     Status: Abnormal   Collection Time: 10/15/14  4:26 AM  Result Value Ref Range   Sodium 134 (L) 135 - 145 mmol/L   Potassium 4.2 3.5 - 5.1 mmol/L   Chloride 101 101 - 111 mmol/L   CO2 23 22 - 32 mmol/L   Glucose, Bld 405 (H) 65 - 99 mg/dL   BUN 19 6 - 20 mg/dL   Creatinine, Ser 1.22 0.61 - 1.24 mg/dL   Calcium 10.0 8.9 - 10.3 mg/dL   GFR calc non Af Amer 59 (L) >60 mL/min   GFR calc Af Amer >60 >60 mL/min    Comment: (NOTE) The eGFR has been calculated using the CKD EPI equation. This calculation has not been validated in all clinical situations. eGFR's persistently <60 mL/min signify possible Chronic Kidney Disease.    Anion gap 10 5 - 15    Dg Chest 2 View  10/14/2014   CLINICAL DATA:  Cough for 1 week  EXAM: CHEST  2 VIEW  COMPARISON:  09/25/2014  FINDINGS: The heart size and mediastinal contours are within normal limits. Both lungs are clear. The visualized skeletal structures are unremarkable.  IMPRESSION: No active cardiopulmonary disease.   Electronically Signed   By: Skipper Cliche M.D.   On: 10/14/2014 13:49   Ct Head Wo Contrast  10/14/2014   CLINICAL DATA:  Acute onset of confusion. Hyperglycemia and high blood pressure. Initial encounter.  EXAM: CT HEAD WITHOUT CONTRAST  TECHNIQUE: Contiguous axial images were obtained from the base of the skull through the vertex without  intravenous contrast.  COMPARISON:  CT of the head performed 09/25/2014  FINDINGS: There is no evidence of acute infarction, mass lesion, or intra-  or extra-axial hemorrhage on CT.  Prominence of the ventricles and sulci reflects moderate cortical volume loss. Mild cerebellar atrophy is noted. Scattered periventricular white matter change likely reflects small vessel ischemic microangiopathy.  The brainstem and fourth ventricle are within normal limits. The basal ganglia are unremarkable in appearance. The cerebral hemispheres demonstrate grossly normal gray-white differentiation. No mass effect or midline shift is seen.  There is no evidence of fracture; visualized osseous structures are unremarkable in appearance. The orbits are within normal limits. The paranasal sinuses and mastoid air cells are well-aerated. No significant soft tissue abnormalities are seen.  IMPRESSION: 1. No acute intracranial pathology seen on CT. 2. Moderate cortical volume loss and scattered small vessel ischemic microangiopathy.   Electronically Signed   By: Garald Balding M.D.   On: 10/14/2014 18:40   Mr Brain Wo Contrast  10/15/2014   CLINICAL DATA:  69 year old male with confusion, dizziness, nausea. Initial encounter.  EXAM: MRI HEAD WITHOUT CONTRAST  TECHNIQUE: Multiplanar, multiecho pulse sequences of the brain and surrounding structures were obtained without intravenous contrast.  COMPARISON:  Head CT without contrast 10/14/2014 and earlier.  FINDINGS: Mildly prominent lateral ventricles, primarily at the atria, but overall ventricular size and cerebral volume are within normal limits for age. There is a mildly angulated appearance of both atria which might indicate perinatal white matter disease.  No restricted diffusion or evidence of acute infarction. Major intracranial vascular flow voids are within normal limits.  Patchy bilateral cerebral white matter T2 and FLAIR hyperintensity. More confluent T2 and FLAIR hyperintensity in the pons. Deep gray matter nuclei are within normal limits for age. No cortical encephalomalacia identified. No acute or chronic intracranial  hemorrhage identified. No midline shift. Negative cerebellum and cervicomedullary junction.  The pituitary gland is heterogeneous and lobulated, with abnormal soft tissue extension into the right cavernous sinus (series 9, images 18 and 19). The abnormality involves the central and right gland and encompasses 13 x 15 x 12 mm. The infundibulum is deviated to the left. No suprasellar extension or suprasellar mass effect.  Visualized internal auditory structures appear normal. Trace paranasal sinus mucosal thickening. Mastoids are clear. Visualized orbit soft tissues are within normal limits. Visualized scalp soft tissues are within normal limits. Normal bone marrow signal. Degenerative changes in the visible cervical spine.  IMPRESSION: 1. 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 No suprasellar extension or suprasellar mass effect. Dedicated pituitary protocol brain MRI without and with contrast would characterize further. 2. No other acute intracranial abnormality identified. 3. Moderate nonspecific white matter and pontine signal changes, most commonly due to chronic small vessel disease.   Electronically Signed   By: Genevie Ann M.D.   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 I am more inclined to believe that his reported gait impairment with recurrent falls is likely resulting from his underlying dementia, as his exam is unrevealing and the CT  findings will not explains his imbalance/falls. In any case, I am ordering MRI  brain tailored to the pituitary gland to better elucidate the CT findings. Check hormone profile. Will follow up after above testing.   Dorian Pod, MD 10/15/2014, 11:13 AM  [facility]

## 2014-10-15 NOTE — ED Notes (Signed)
Per previous shift report, pt has bed ready (1408), however 4th floor wants to reject pt due to "possible stroke". Instructed to call 4th floor charge RN once pt's MRI results are in.

## 2014-10-15 NOTE — ED Notes (Signed)
Patient transported to MRI 

## 2014-10-15 NOTE — ED Notes (Signed)
Admitting MD at bedside.

## 2014-10-15 NOTE — ED Notes (Signed)
Pt is too dizzy to ambulate.  Attempted a few steps and pt reported that he was feeling dizzy.

## 2014-10-15 NOTE — ED Notes (Signed)
Called 4th floor regarding pt's admission, was told charge RN is at the bed meeting and will call me back.

## 2014-10-15 NOTE — Progress Notes (Signed)
Patient blind in right eye

## 2014-10-15 NOTE — ED Notes (Signed)
Dr Erlinda Hong still at bedside.

## 2014-10-15 NOTE — Progress Notes (Signed)
PT Cancellation Note  Patient Details Name: Ruben Williams MRN: [id number] DOB: [birth year]   Cancelled Treatment:    Reason Eval/Treat Not Completed: Medical issues which prohibited therapy. Pt currently on bedrest in orders until 10 pm. Will check back another day to perform eval. Thanks.    Weston Anna, MPT Pager: [phone]
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
## 2014-10-16 DIAGNOSIS — E1140 Type 2 diabetes mellitus with diabetic neuropathy, unspecified: Secondary | ICD-10-CM

## 2014-10-16 DIAGNOSIS — R42 Dizziness and giddiness: Secondary | ICD-10-CM

## 2014-10-16 DIAGNOSIS — F0390 Unspecified dementia without behavioral disturbance: Secondary | ICD-10-CM

## 2014-10-16 DIAGNOSIS — R531 Weakness: Secondary | ICD-10-CM

## 2014-10-16 DIAGNOSIS — I10 Essential (primary) hypertension: Secondary | ICD-10-CM

## 2014-10-16 DIAGNOSIS — I679 Cerebrovascular disease, unspecified: Secondary | ICD-10-CM

## 2014-10-16 DIAGNOSIS — E1149 Type 2 diabetes mellitus with other diabetic neurological complication: Secondary | ICD-10-CM

## 2014-10-16 DIAGNOSIS — D352 Benign neoplasm of pituitary gland: Secondary | ICD-10-CM

## 2014-10-16 HISTORY — DX: Benign neoplasm of pituitary gland: D35.2

## 2014-10-16 HISTORY — DX: Essential (primary) hypertension: I10

## 2014-10-16 HISTORY — DX: Unspecified dementia, unspecified severity, without behavioral disturbance, psychotic disturbance, mood disturbance, and anxiety: F03.90

## 2014-10-16 HISTORY — DX: Type 2 diabetes mellitus with other diabetic neurological complication: E11.49

## 2014-10-16 LAB — CBC
HEMATOCRIT: 39.7 % (ref 39.0–52.0)
HEMOGLOBIN: 13.2 g/dL (ref 13.0–17.0)
MCH: 31.1 pg (ref 26.0–34.0)
MCHC: 33.2 g/dL (ref 30.0–36.0)
MCV: 93.4 fL (ref 78.0–100.0)
PLATELETS: 241 10*3/uL (ref 150–400)
RBC: 4.25 MIL/uL (ref 4.22–5.81)
RDW: 13.1 % (ref 11.5–15.5)
WBC: 5.6 10*3/uL (ref 4.0–10.5)

## 2014-10-16 LAB — COMPREHENSIVE METABOLIC PANEL
ALK PHOS: 75 U/L (ref 38–126)
ALT: 14 U/L — ABNORMAL LOW (ref 17–63)
ANION GAP: 8 (ref 5–15)
AST: 13 U/L — AB (ref 15–41)
Albumin: 3.5 g/dL (ref 3.5–5.0)
BUN: 12 mg/dL (ref 6–20)
CALCIUM: 9.7 mg/dL (ref 8.9–10.3)
CHLORIDE: 106 mmol/L (ref 101–111)
CO2: 24 mmol/L (ref 22–32)
Creatinine, Ser: 0.88 mg/dL (ref 0.61–1.24)
GFR calc Af Amer: >60 mL/min (ref >60)
GFR calc non Af Amer: >60 mL/min (ref >60)
GLUCOSE: 261 mg/dL — AB (ref 65–99)
POTASSIUM: 4.1 mmol/L (ref 3.5–5.1)
Sodium: 138 mmol/L (ref 135–145)
Total Bilirubin: 0.8 mg/dL (ref 0.3–1.2)
Total Protein: 6.2 g/dL — ABNORMAL LOW (ref 6.5–8.1)

## 2014-10-16 LAB — HIV ANTIBODY (ROUTINE TESTING W REFLEX): HIV Screen 4th Generation wRfx: NONREACTIVE

## 2014-10-16 LAB — LIPID PANEL
CHOL/HDL RATIO: 4.2 ratio
Cholesterol: 177 mg/dL (ref 0–200)
HDL: 42 mg/dL (ref >40)
LDL Cholesterol: 109 mg/dL — ABNORMAL HIGH (ref 0–99)
Triglycerides: 129 mg/dL (ref <150)
VLDL: 26 mg/dL (ref 0–40)

## 2014-10-16 LAB — I-STAT TROPONIN, ED: Troponin i, poc: 0.01 ng/mL (ref 0.00–0.08)

## 2014-10-16 LAB — HEMOGLOBIN A1C
HEMOGLOBIN A1C: 11.4 % — AB (ref 4.8–5.6)
Mean Plasma Glucose: 280 mg/dL

## 2014-10-16 LAB — GLUCOSE, CAPILLARY
GLUCOSE-CAPILLARY: 155 mg/dL — AB (ref 65–99)
GLUCOSE-CAPILLARY: 203 mg/dL — AB (ref 65–99)
Glucose-Capillary: 216 mg/dL — ABNORMAL HIGH (ref 65–99)
Glucose-Capillary: 277 mg/dL — ABNORMAL HIGH (ref 65–99)

## 2014-10-16 LAB — GROWTH HORMONE: Growth Hormone: 0.9 ng/mL (ref 0.0–10.0)

## 2014-10-16 LAB — PROTIME-INR
INR: 1.13 (ref 0.00–1.49)
Prothrombin Time: 14.7 seconds (ref 11.6–15.2)

## 2014-10-16 LAB — INSULIN-LIKE GROWTH FACTOR: SOMATOMEDIN C: 124 ng/mL (ref 47–192)

## 2014-10-16 LAB — FOLLICLE STIMULATING HORMONE: FSH: 7 m[IU]/mL (ref 1.5–12.4)

## 2014-10-16 LAB — FOLATE RBC
FOLATE, HEMOLYSATE: 400.1 ng/mL
Folate, RBC: 1003 ng/mL (ref >498)
HEMATOCRIT: 39.9 % (ref 37.5–51.0)

## 2014-10-16 LAB — CORTISOL: Cortisol, Plasma: 12 ug/dL

## 2014-10-16 LAB — RPR: RPR Ser Ql: NONREACTIVE

## 2014-10-16 MED ORDER — INSULIN GLARGINE 100 UNIT/ML ~~LOC~~ SOLN
20.0000 [IU] | Freq: Every day | SUBCUTANEOUS | Status: DC
  Administered 2014-10-16 – 2014-10-17 (×2): 20 [IU] via SUBCUTANEOUS
  Filled 2014-10-16 (×3): qty 0.2

## 2014-10-16 MED ORDER — AMLODIPINE BESYLATE 5 MG PO TABS
5.0000 mg | ORAL_TABLET | Freq: Every day | ORAL | Status: DC
  Administered 2014-10-16 – 2014-10-25 (×9): 5 mg via ORAL
  Filled 2014-10-16 (×11): qty 1

## 2014-10-16 MED ORDER — INSULIN ASPART 100 UNIT/ML ~~LOC~~ SOLN
0.0000 [IU] | Freq: Every day | SUBCUTANEOUS | Status: DC
  Administered 2014-10-16: 2 [IU] via SUBCUTANEOUS

## 2014-10-16 MED ORDER — INSULIN ASPART 100 UNIT/ML ~~LOC~~ SOLN
0.0000 [IU] | Freq: Three times a day (TID) | SUBCUTANEOUS | Status: DC
  Administered 2014-10-16: 3 [IU] via SUBCUTANEOUS
  Administered 2014-10-17: 11 [IU] via SUBCUTANEOUS
  Administered 2014-10-17: 3 [IU] via SUBCUTANEOUS
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
## 2014-10-16 NOTE — Progress Notes (Signed)
Pt complains of itching and rash to left upper arm that is painful and itching to his private region. Rash noted to left upper arm and anterior aspect of his left shoulder. Pt states he may have came in contact with some plant while in his yard.

## 2014-10-16 NOTE — Progress Notes (Signed)
Progress Note   Ruben Williams:[id number] DOB: 05-Aug-1945 DOA: 10/15/2014 PCP: PROVIDER NOT IN SYSTEM   Brief Narrative:   Ruben Williams is an 69 y.o. male with a PMH of hypertension, diabetes, dementia, CVA who was admitted 10/15/14 with a chief complaint of dizziness, unsteady gait, and headache. 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  Assessment/Plan:   Principal Problem:   Pituitary macroadenoma with extrasellar extension - Neurology following. - Follow-up cortisol, ACTH, testosterone, prolactin. - FSH WNL at 7. Growth hormone WNL at 0.9. Insulin-like growth factor WNL at 124. Free T4 WNL at 1.04.  Active Problems:   Dizziness / Weakness - PT/OT evaluations requested.    Type 2 diabetes mellitus with neurological manifestations, uncontrolled - Take 70/30 insulin, 12 units 3 times a day at home. Currently being managed with moderate scale SSI 3 times a day. - CBGs [id number]. Will add 20 units of Lantus to regimen. Hemoglobin A1c 11.4% indicating suboptimal control. - We'll consult diabetes coordinator.    Essential hypertension, benign - Not on antihypertensives. Start Norvasc.    Dementia - PT/OT evaluations. - Has refused placement despite multiple visits to the healthcare system and concerns about ability to care for himself. - Psychiatry evaluation requested for competency.    Diffuse cerebrovascular disease - Continue aspirin. Needs better risk factor modification including diabetes and hypertension control. - Check lipid panel.    DVT Prophylaxis - Continue Lovenox.  Code Status: Full. Family Communication: No family at the bedside. Has a brother and sister in the area, but declines my offer to call them. Disposition Plan: Home vs SNF when stable. Refuses SNF, but has had 4 ED visits in the last 6 months and lacks self-care ability. We'll get psychiatry evaluation for competency.   IV Access:     Peripheral IV   Procedures and diagnostic studies:   Dg Chest 2 View  10/14/2014   CLINICAL DATA:  Cough for 1 week  EXAM: CHEST  2 VIEW  COMPARISON:  09/25/2014  FINDINGS: The heart size and mediastinal contours are within normal limits. Both lungs are clear. The visualized skeletal structures are unremarkable.  IMPRESSION: No active cardiopulmonary disease.   Electronically Signed   By: Skipper Cliche M.D.   On: 10/14/2014 13:49   Ct Head Wo Contrast  10/14/2014   CLINICAL DATA:  Acute onset of confusion. Hyperglycemia and high blood pressure. Initial encounter.  EXAM: CT HEAD WITHOUT CONTRAST  TECHNIQUE: Contiguous axial images were obtained from the base of the skull through the vertex without intravenous contrast.  COMPARISON:  CT of the head performed 09/25/2014  FINDINGS: There is no evidence of acute infarction, mass lesion, or intra- or extra-axial hemorrhage on CT.  Prominence of the ventricles and sulci reflects moderate cortical volume loss. Mild cerebellar atrophy is noted. Scattered periventricular white matter change likely reflects small vessel ischemic microangiopathy.  The brainstem and fourth ventricle are within normal limits. The basal ganglia are unremarkable in appearance. The cerebral hemispheres demonstrate grossly normal gray-white differentiation. No mass effect or midline shift is seen.  There is no evidence of fracture; visualized osseous structures are unremarkable in appearance. The orbits are within normal limits. The paranasal sinuses and mastoid air cells are well-aerated. No significant soft tissue abnormalities are seen.  IMPRESSION: 1. No acute intracranial pathology seen on CT. 2. Moderate cortical volume loss and scattered small vessel ischemic microangiopathy.   Electronically Signed   By:  Garald Balding M.D.   On: 10/14/2014 18:40   Mr Brain Wo Contrast  10/15/2014   CLINICAL DATA:  69 year old male with confusion, dizziness, nausea. Initial encounter.   EXAM: MRI HEAD WITHOUT CONTRAST  TECHNIQUE: Multiplanar, multiecho pulse sequences of the brain and surrounding structures were obtained without intravenous contrast.  COMPARISON:  Head CT without contrast 10/14/2014 and earlier.  FINDINGS: Mildly prominent lateral ventricles, primarily at the atria, but overall ventricular size and cerebral volume are within normal limits for age. There is a mildly angulated appearance of both atria which might indicate perinatal white matter disease.  No restricted diffusion or evidence of acute infarction. Major intracranial vascular flow voids are within normal limits.  Patchy bilateral cerebral white matter T2 and FLAIR hyperintensity. More confluent T2 and FLAIR hyperintensity in the pons. Deep gray matter nuclei are within normal limits for age. No cortical encephalomalacia identified. No acute or chronic intracranial hemorrhage identified. No midline shift. Negative cerebellum and cervicomedullary junction.  The pituitary gland is heterogeneous and lobulated, with abnormal soft tissue extension into the right cavernous sinus (series 9, images 18 and 19). The abnormality involves the central and right gland and encompasses 13 x 15 x 12 mm. The infundibulum is deviated to the left. No suprasellar extension or suprasellar mass effect.  Visualized internal auditory structures appear normal. Trace paranasal sinus mucosal thickening. Mastoids are clear. Visualized orbit soft tissues are within normal limits. Visualized scalp soft tissues are within normal limits. Normal bone marrow signal. Degenerative changes in the visible cervical spine.  IMPRESSION: 1. 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 No suprasellar extension or suprasellar mass effect. Dedicated pituitary protocol brain MRI without and with contrast would characterize further. 2. No other acute intracranial abnormality  identified. 3. Moderate nonspecific white matter and pontine signal changes, most commonly due to chronic small vessel disease.   Electronically Signed   By: Genevie Ann M.D.   On: 10/15/2014 08:28   Mr Kizzie Fantasia Contrast  10/15/2014   CLINICAL DATA:  69 year old male with abnormal pituitary gland on the earlier routine noncontrast brain MRI. Initial encounter.  EXAM: MRI HEAD WITHOUT AND WITH CONTRAST  TECHNIQUE: Multiplanar, multiecho pulse sequences of the brain and surrounding structures were obtained without and with intravenous contrast.  CONTRAST:  67mL MULTIHANCE GADOBENATE DIMEGLUMINE 529 MG/ML IV SOLN  COMPARISON:  Brain MRI without contrast 0726 hours today.  FINDINGS: Pre and postcontrast pituitary protocol imaging of the brain such that brain sequences which would have been redundant with this morning's exam were not performed.  Thin slice pre and post-contrast imaging through the pituitary region confirms heterogeneously enhancing soft tissue extending from the central sella turcica rightward toward the cavernous sinus. On these images this encompasses 14 x 13 x 11 mm. The more homogeneously enhancing pituitary tissue appears deviated to the left along with the infundibulum which appears normal. No suprasellar extension or suprasellar mass effect. Normal hypothalamus. The lesion is inseparable from the medial aspect of the right cavernous sinus but does not surround the right ICA siphon. No skullbase infiltration is identified.  No other abnormal enhancement is identified about the brain.  IMPRESSION: Confirmed pituitary macroadenoma with suggestion of early involvement of the right cavernous sinus.  14-15 mm lesion without suprasellar extension or mass effect.  No other abnormal enhancement of the brain.   Electronically Signed   By: Herminio Heads.D.  On: 10/15/2014 14:27     Medical Consultants:    Neurology.  Psychiatry.  Anti-Infectives:    None.  Subjective:   Skyline denies  headache today but says he is "short winded" and dizzy.  He tells me that he has a brother, but that he is not close to him and expresses disparaging remarks about him. He also makes disparaging comments about his sister and it is clear he does not feel cared for by his siblings. Remarks border on paranoia. Claims to not know that he has a growth in his brain and that "no one told him ".  Objective:    Filed Vitals:   10/15/14 1504 10/15/14 1506 10/15/14 2107 10/16/14 0520  BP: 161/75 179/87 134/91 156/89  Pulse: 70 88 80 67  Temp:   98.5 F (36.9 C) 98.4 F (36.9 C)  TempSrc:   Oral Oral  Resp: 20  18 18   Height:      Weight:      SpO2:  97% 100% 100%    Intake/Output Summary (Last 24 hours) at 10/16/14 0839 Last data filed at 10/16/14 0700  Gross per 24 hour  Intake 2071.25 ml  Output    675 ml  Net 1396.25 ml    Exam: Gen:  NAD. Cardiovascular:  RRR, No M/R/G Respiratory:  Lungs CTAB Gastrointestinal:  Abdomen soft, NT/ND, + BS Extremities:  No C/E/C   Data Reviewed:    Labs: Basic Metabolic Panel:  Recent Labs Lab 10/14/14 1255 10/15/14 0426 10/15/14 1140 10/16/14 0425  NA 137 134*  --  138  K 4.2 4.2  --  4.1  CL 103 101  --  106  CO2 26 23  --  24  GLUCOSE 290* 405*  --  261*  BUN 18 19  --  12  CREATININE 0.81 1.22 0.78 0.88  CALCIUM 10.1 10.0  --  9.7   GFR Estimated Creatinine Clearance: 90.8 mL/min (by C-G formula based on Cr of 0.88). Liver Function Tests:  Recent Labs Lab 10/14/14 1255 10/16/14 0425  AST 15 13*  ALT 16* 14*  ALKPHOS 88 75  BILITOT 0.7 0.8  PROT 7.0 6.2*  ALBUMIN 4.0 3.5   Coagulation profile  Recent Labs Lab 10/16/14 0425  INR 1.13    CBC:  Recent Labs Lab 10/14/14 1255 10/15/14 0426 10/15/14 1140 10/16/14 0425  WBC 4.7 5.1 6.4 5.6  NEUTROABS 3.2 3.9  --   --   HGB 14.1 14.0 13.1 13.2  HCT 42.6 41.6 40.2 39.7  MCV 93.4 91.8 92.2 93.4  PLT 264 260 238 241   CBG:  Recent Labs Lab 10/15/14 0407  10/15/14 0613 10/15/14 1132 10/15/14 1647 10/15/14 2059  GLUCAP 425* 196* 219* 214* 233*   Hgb A1c:  Recent Labs  10/15/14 1140  HGBA1C 11.4*   Thyroid function studies:  Recent Labs  10/15/14 1030  TSH 2.553   Anemia work up:  Recent Labs  10/15/14 [place]    Microbiology No results found for this or any previous visit (from the past 240 hour(s)).   Medications:   . enoxaparin (LOVENOX) injection  40 mg Subcutaneous Q24H  . insulin aspart  0-15 Units Subcutaneous TID WC   Continuous Infusions: . sodium chloride 75 mL/hr at 10/16/14 0336    Time spent: 35 minutes.  The patient is medically complex and requires high complexity decision making and coordination of care among multiple specialists.    LOS: 1 day  [facility] Pager [phone]. If unable to reach me by pager, please call my cell phone at [phone].  *Please refer to [email or web address], password [email or web address] to get updated schedule on who will round on this patient, as hospitalists switch teams weekly. If 7PM-7AM, please contact night-coverage at [email or web address], password [email or web address] for any overnight needs.  10/16/2014, 8:39 AM
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
## 2014-10-17 ENCOUNTER — Encounter (HOSPITAL_COMMUNITY): Payer: Self-pay | Admitting: Internal Medicine

## 2014-10-17 DIAGNOSIS — E1165 Type 2 diabetes mellitus with hyperglycemia: Secondary | ICD-10-CM

## 2014-10-17 DIAGNOSIS — E785 Hyperlipidemia, unspecified: Secondary | ICD-10-CM

## 2014-10-17 HISTORY — DX: Hyperlipidemia, unspecified: E78.5

## 2014-10-17 LAB — PROLACTIN: Prolactin: 10.6 ng/mL (ref 4.0–15.2)

## 2014-10-17 LAB — TESTOSTERONE: TESTOSTERONE: 390 ng/dL (ref 348–1197)

## 2014-10-17 LAB — GLUCOSE, CAPILLARY
GLUCOSE-CAPILLARY: 310 mg/dL — AB (ref 65–99)
Glucose-Capillary: 166 mg/dL — ABNORMAL HIGH (ref 65–99)
Glucose-Capillary: 150 mg/dL — ABNORMAL HIGH (ref 65–99)
Glucose-Capillary: 310 mg/dL — ABNORMAL HIGH (ref 65–99)

## 2014-10-17 LAB — VITAMIN B1: VITAMIN B1 (THIAMINE): 110.8 nmol/L (ref 66.5–200.0)

## 2014-10-17 LAB — ACTH: C206 ACTH: 25.2 pg/mL (ref 7.2–63.3)

## 2014-10-17 MED ORDER — ACETAMINOPHEN 325 MG PO TABS
650.0000 mg | ORAL_TABLET | ORAL | Status: DC | PRN
  Administered 2014-10-17 – 2014-10-19 (×2): 650 mg via ORAL
  Administered 2014-10-20: 325 mg via ORAL
  Filled 2014-10-17 (×3): qty 2

## 2014-10-17 MED ORDER — INSULIN ASPART 100 UNIT/ML ~~LOC~~ SOLN: 4.0000 [IU] | Freq: Three times a day (TID) | SUBCUTANEOUS | Status: DC

## 2014-10-17 MED ORDER — INSULIN GLARGINE 100 UNIT/ML ~~LOC~~ SOLN
25.0000 [IU] | Freq: Every day | SUBCUTANEOUS | Status: DC
  Filled 2014-10-17: qty 0.25

## 2014-10-17 MED ORDER — INSULIN GLARGINE 100 UNIT/ML ~~LOC~~ SOLN
25.0000 [IU] | Freq: Every day | SUBCUTANEOUS | Status: DC
Start: 2014-10-18 — End: 2014-10-25
  Administered 2014-10-18 – 2014-10-25 (×8): 25 [IU] via SUBCUTANEOUS
  Filled 2014-10-17 (×8): qty 0.25

## 2014-10-17 MED ORDER — ATORVASTATIN CALCIUM 20 MG PO TABS
20.0000 mg | ORAL_TABLET | Freq: Every day | ORAL | Status: DC
  Administered 2014-10-17 – 2014-10-25 (×8): 20 mg via ORAL
  Filled 2014-10-17 (×10): qty 1

## 2014-10-17 MED ORDER — INSULIN ASPART 100 UNIT/ML ~~LOC~~ SOLN
0.0000 [IU] | Freq: Every day | SUBCUTANEOUS | Status: DC
Start: 2014-10-17 — End: 2014-10-20
  Administered 2014-10-17: 4 [IU] via SUBCUTANEOUS

## 2014-10-17 MED ORDER — INSULIN ASPART 100 UNIT/ML ~~LOC~~ SOLN
0.0000 [IU] | Freq: Three times a day (TID) | SUBCUTANEOUS | Status: DC
  Administered 2014-10-18: 3 [IU] via SUBCUTANEOUS
  Administered 2014-10-18: 2 [IU] via SUBCUTANEOUS
  Administered 2014-10-18: 5 [IU] via SUBCUTANEOUS
  Administered 2014-10-19 (×3): 3 [IU] via SUBCUTANEOUS

## 2014-10-17 MED ORDER — HYDROCORTISONE 1 % EX CREA
TOPICAL_CREAM | Freq: Three times a day (TID) | CUTANEOUS | Status: DC
  Administered 2014-10-17 – 2014-10-24 (×17): via TOPICAL
  Administered 2014-10-24: 1 via TOPICAL
  Administered 2014-10-25 (×2): via TOPICAL
  Filled 2014-10-17 (×3): qty 28

## 2014-10-17 MED ORDER — HYDROXYZINE HCL 25 MG PO TABS
25.0000 mg | ORAL_TABLET | Freq: Two times a day (BID) | ORAL | Status: DC
  Administered 2014-10-17 – 2014-10-22 (×11): 25 mg via ORAL
  Filled 2014-10-17 (×13): qty 1

## 2014-10-17 NOTE — Progress Notes (Signed)
Inpatient Diabetes Program Recommendations  AACE/ADA: New Consensus Statement on Inpatient Glycemic Control (2013)  Target Ranges:  Prepandial:   less than 140 mg/dL      Peak postprandial:   less than 180 mg/dL (1-2 hours)      Critically ill patients:  140 - 180 mg/dL   Reason for Visit: Hyperglycemia  Results for Ruben Williams, Ruben Williams (MRN [id number]) as of 10/17/2014 10:52  Ref. Range 10/15/2014 20:59 10/16/2014 07:35 10/16/2014 12:09 10/16/2014 17:17 10/16/2014 20:55  Glucose-Capillary Latest Ref Range: 65-99 mg/dL 233 (H) 216 (H) 277 (H) 155 (H) 203 (H)   Results for [name], [name] (MRN [id number]) as of 10/17/2014 10:52  Ref. Range 10/15/2014 11:40  Hemoglobin A1C Latest Ref Range: 4.8-5.6 % 11.4 (H)   Uncontrolled blood sugars.  Recommendations: Increase Lantus to 25 units QHS Add Novolog 4 units tidwc for meal coverage insulin if pt eats > 50% meal Will need insulin adjustment prior to discharge with elevated HgbA1C.  Will continue to follow. Thank you. Lorenda Peck, RD, LDN, CDE Inpatient Diabetes Coordinator [phone]

## 2014-10-17 NOTE — Progress Notes (Signed)
Assumed care of pt at this time. Pt is stable with no complaints. Will continue to monitor.  Othella Boyer [facility] 10/17/2014

## 2014-10-17 NOTE — Consult Note (Signed)
[place] [facility] Consult   Reason for Consult:  Capacity evaluation Referring Physician:  Dr. Rockne Menghini Patient Identification: Ruben Williams MRN:  [id number] Principal Diagnosis: Dementia Diagnosis:   Patient Active Problem List   Diagnosis Date Noted  . 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 Essential hypertension, benign [I10] 10/16/2014  . Dementia [F03.90] 10/16/2014  . 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 Dizziness [R42] 10/15/2014  . Weakness [R53.1] 10/15/2014    Total Time spent with patient: 1 hour  Subjective:   Ruben Williams is a 69 y.o. male patient admitted with dizziness and headaches.  HPI: Ruben Williams is a 69 years old male seen for psychiatric consultation and evaluation for capacity evaluation. Patient was admitted to [facility] with the progressive unsteadiness, stumbling, dizziness and intermittent headaches. Psychiatric consultation requested because of multiple hospitalization with the same complaints. Patient stated he has been living by himself and has no family members involved in his care. Patient reported he does not know why he was admitted to the hospital or why he came to the hospital. Patient knows he has a diabetes need to take insulin but not elevated off blood pressure or stroke. Patient was not able to care for his diabetic treatment as required because of frequent confusion, forgetfulness and unable to remember basic needs. Patient needed redirection's and frequent clues to answer simple questions like orientation, concentration, memory and has a poor language functions. Patient has limited understanding about his current medical conditions and mental status and needed treatment. Patient stated he has 2 sons but could not not tell there names, where they areliving, what they're doing and how much  relationship he has with them. Patient reported his wife died but could not tell me how she died when she died. Based on my evaluation I believe that patient does not meet criteria for capacity to make his own medical decisions and living arrangements and he will be better off placed out of home at this time. Patient denied drug abuse and drinking alcohol. Patient has no previous acute psychiatric hospitalization.  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 He was recently seen in the ED for multiple times due to concerning patient not able to take care of himself by home health RN. In fact, He was discharged from the emergency department at 2000 yesterday. He returns to the ED this evening via EMS. Per patient was reported to the ED this time by himself due to concerning progressive unsteadiness, stumbling, though no frank falls, he also reported feeling dizzy, some intermittent headache, though by chart review he was here today due to nausea, I did see vomit in the sink. Patient is a very poor historian, and very forgetful. Patient was observed to have one episode of emesis just following arrival to the ED.currently, he denies acute complaints.   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 hospitalist called for admission, for further work up and eventually will need placement. Currently, patient denies fever, denies chest pain, no headache, no n/v, no ab pain, no lateralized weakness.   Past Medical  History:  Past Medical History  Diagnosis Date  . Diabetes mellitus   . Hypertension   . Stroke     Past Surgical History  Procedure  Laterality Date  . Knee arthroscopy    . Joint replacement      left knee  . Left and right knee arthroscopy    . Tonsillectomy    . Laser surgery to right eye     Family History:  Family History  Problem Relation Age of Onset  . Hypertension Father    Social History:  History  Alcohol Use No     History  Drug Use No    History   Social History  . Marital Status: Widowed    Spouse Name: N/A  . Number of Children: N/A  . Years of Education: N/A   Social History Main Topics  . Smoking status: Never Smoker   . Smokeless tobacco: Never Used  . Alcohol Use: No  . Drug Use: No  . Sexual Activity: Not Currently   Other Topics Concern  . None   Social History Narrative   Additional Social History:                          Allergies:  No Known Allergies  Labs:  Results for orders placed or performed during the hospital encounter of 10/15/14 (from the past 48 hour(s))  Glucose, capillary     Status: Abnormal   Collection Time: 10/15/14 11:32 AM  Result Value Ref Range   Glucose-Capillary 219 (H) 65 - 99 mg/dL  Hemoglobin A1c     Status: Abnormal   Collection Time: 10/15/14 11:40 AM  Result Value Ref Range   Hgb A1c MFr Bld 11.4 (H) 4.8 - 5.6 %    Comment: (NOTE)         Pre-diabetes: 5.7 - 6.4         Diabetes: >6.4         Glycemic control for adults with diabetes: <7.0    Mean Plasma Glucose 280 mg/dL    Comment: (NOTE) Performed At: [facility] [place], [place] [id number] Lindon Romp MD [id number]   Vitamin B12     Status: None   Collection Time: 10/15/14 11:40 AM  Result Value Ref Range   Vitamin B-12 401 180 - 914 pg/mL    Comment: (NOTE) This assay is not validated for testing neonatal or myeloproliferative syndrome specimens for Vitamin B12 levels. Performed at [facility]   Folate RBC     Status: None   Collection Time: 10/15/14 11:40 AM  Result Value Ref Range   Folate, Hemolysate 400.1 Not  Estab. ng/mL   Hematocrit 39.9 37.5 - 51.0 %   Folate, RBC 1003 >498 ng/mL    Comment: (NOTE) Performed At: [facility] [place], [place] [id number] Lindon Romp MD KG:[id number]   RPR     Status: None   Collection Time: 10/15/14 11:40 AM  Result Value Ref Range   RPR Ser Ql Non Reactive Non Reactive    Comment: (NOTE) Performed At: [facility] [place], [place] [id number] Lindon Romp MD [id number]   HIV antibody     Status: None   Collection Time: 10/15/14 11:40 AM  Result Value Ref Range   HIV Screen 4th Generation wRfx Non Reactive Non Reactive    Comment: (NOTE) Performed At: [facility] [place], [place]  [id number] Lindon Romp MD [id number]   CBC     Status: None   Collection Time: 10/15/14 11:40 AM  Result Value Ref Range   WBC 6.4 4.0 - 10.5 K/uL   RBC 4.36 4.22 - 5.81 MIL/uL   Hemoglobin 13.1 13.0 - 17.0 g/dL   HCT 40.2 39.0 - 52.0 %   MCV 92.2 78.0 - 100.0 fL   MCH 30.0 26.0 - 34.0 pg   MCHC 32.6 30.0 - 36.0 g/dL   RDW 13.1 11.5 - 15.5 %   Platelets 238 150 - 400 K/uL  Creatinine, serum     Status: None   Collection Time: 10/15/14 11:40 AM  Result Value Ref Range   Creatinine, Ser 0.78 0.61 - 1.24 mg/dL   GFR calc non Af Amer >60 >60 mL/min   GFR calc Af Amer >60 >60 mL/min    Comment: (NOTE) The eGFR has been calculated using the CKD EPI equation. This calculation has not been validated in all clinical situations. eGFR's persistently <60 mL/min signify possible Chronic Kidney Disease.   Follicle stimulating hormone     Status: None   Collection Time: 10/15/14 11:40 AM  Result Value Ref Range   FSH 7.0 1.5 - 12.4 mIU/mL    Comment: (NOTE) Performed At: [facility] [place], [place] [id number] Lindon Romp MD [id number]   Growth hormone     Status: None   Collection Time: 10/15/14 11:40 AM  Result Value Ref Range   Growth Hormone  0.9 0.0 - 10.0 ng/mL    Comment: (NOTE) Performed At: [facility] [place], [place] [id number] Lindon Romp MD [id number]   Insulin-like growth factor     Status: None   Collection Time: 10/15/14 11:40 AM  Result Value Ref Range   Somatomedin C 124 47 - 192 ng/mL    Comment: (NOTE) Performed At: [facility] [place], [place] [id number] Lindon Romp MD [id number]   T4, free     Status: None   Collection Time: 10/15/14 11:40 AM  Result Value Ref Range   Free T4 1.04 0.61 - 1.12 ng/dL    Comment: Performed at [facility]  Glucose, capillary     Status: Abnormal   Collection Time: 10/15/14  4:47 PM  Result Value Ref Range   Glucose-Capillary 214 (H) 65 - 99 mg/dL  Glucose, capillary     Status: Abnormal   Collection Time: 10/15/14  8:59 PM  Result Value Ref Range   Glucose-Capillary 233 (H) 65 - 99 mg/dL  Comprehensive metabolic panel     Status: Abnormal   Collection Time: 10/16/14  4:25 AM  Result Value Ref Range   Sodium 138 135 - 145 mmol/L   Potassium 4.1 3.5 - 5.1 mmol/L   Chloride 106 101 - 111 mmol/L   CO2 24 22 - 32 mmol/L   Glucose, Bld 261 (H) 65 - 99 mg/dL   BUN 12 6 - 20 mg/dL   Creatinine, Ser 0.88 0.61 - 1.24 mg/dL   Calcium 9.7 8.9 - 10.3 mg/dL   Total Protein 6.2 (L) 6.5 - 8.1 g/dL   Albumin 3.5 3.5 - 5.0 g/dL   AST 13 (L) 15 - 41 U/L   ALT 14 (L) 17 - 63 U/L   Alkaline Phosphatase 75 38 - 126 U/L   Total Bilirubin 0.8 0.3 - 1.2 mg/dL   GFR calc non Af Amer >60 >60 mL/min   GFR calc  Af Amer >60 >60 mL/min    Comment: (NOTE) The eGFR has been calculated using the CKD EPI equation. This calculation has not been validated in all clinical situations. eGFR's persistently <60 mL/min signify possible Chronic Kidney Disease.    Anion gap 8 5 - 15  CBC     Status: None   Collection Time: 10/16/14  4:25 AM  Result Value Ref Range   WBC 5.6 4.0 - 10.5 K/uL   RBC 4.25 4.22 - 5.81 MIL/uL    Hemoglobin 13.2 13.0 - 17.0 g/dL   HCT 39.7 39.0 - 52.0 %   MCV 93.4 78.0 - 100.0 fL   MCH 31.1 26.0 - 34.0 pg   MCHC 33.2 30.0 - 36.0 g/dL   RDW 13.1 11.5 - 15.5 %   Platelets 241 150 - 400 K/uL  Protime-INR     Status: None   Collection Time: 10/16/14  4:25 AM  Result Value Ref Range   Prothrombin Time 14.7 11.6 - 15.2 seconds   INR 1.13 0.00 - 1.49  Cortisol     Status: None   Collection Time: 10/16/14  4:25 AM  Result Value Ref Range   Cortisol, Plasma 12.0 ug/dL    Comment: (NOTE) AM    6.7 - 22.6 ug/dL PM   <10.0       ug/dL Performed at [facility]   Testosterone     Status: None   Collection Time: 10/16/14  4:25 AM  Result Value Ref Range   Testosterone 390 348 - 1197 ng/dL   Comment, Testosterone Comment     Comment: (NOTE) Adult male reference interval is based on a population of lean males up to 69 years old. Performed At: [facility] [place], [place] [id number] Lindon Romp MD [id number]   Prolactin     Status: None   Collection Time: 10/16/14  4:25 AM  Result Value Ref Range   Prolactin 10.6 4.0 - 15.2 ng/mL    Comment: (NOTE) Performed At: [facility] [place], [place] [id number] Lindon Romp MD [id number]   Lipid panel     Status: Abnormal   Collection Time: 10/16/14  4:25 AM  Result Value Ref Range   Cholesterol 177 0 - 200 mg/dL   Triglycerides 129 <150 mg/dL   HDL 42 >40 mg/dL   Total CHOL/HDL Ratio 4.2 RATIO   VLDL 26 0 - 40 mg/dL   LDL Cholesterol 109 (H) 0 - 99 mg/dL    Comment:        Total Cholesterol/HDL:CHD Risk Coronary Heart Disease Risk Table                     Men   Women  1/2 Average Risk   3.4   3.3  Average Risk       5.0   4.4  2 X Average Risk   9.6   7.1  3 X Average Risk  23.4   11.0        Use the calculated Patient Ratio above and the CHD Risk Table to determine the patient's CHD Risk.        ATP III CLASSIFICATION (LDL):  <100     mg/dL    Optimal  100-129  mg/dL   Near or Above                    Optimal  130-159  mg/dL   Borderline  160-189  mg/dL   High  >190     mg/dL   Very High Performed at [facility]   Glucose, capillary     Status: Abnormal   Collection Time: 10/16/14  7:35 AM  Result Value Ref Range   Glucose-Capillary 216 (H) 65 - 99 mg/dL  Glucose, capillary     Status: Abnormal   Collection Time: 10/16/14 12:09 PM  Result Value Ref Range   Glucose-Capillary 277 (H) 65 - 99 mg/dL  Glucose, capillary     Status: Abnormal   Collection Time: 10/16/14  5:17 PM  Result Value Ref Range   Glucose-Capillary 155 (H) 65 - 99 mg/dL  Glucose, capillary     Status: Abnormal   Collection Time: 10/16/14  8:55 PM  Result Value Ref Range   Glucose-Capillary 203 (H) 65 - 99 mg/dL    Vitals: Blood pressure 133/82, pulse 73, temperature 98.1 F (36.7 C), temperature source Oral, resp. rate 18, height _0  (1.854 m), weight 85.9 kg (189 lb 6 oz), SpO2 100 %.  Risk to Self: Is patient at risk for suicide?: No Risk to Others:   Prior Inpatient Therapy:   Prior Outpatient Therapy:    Current Facility-Administered Medications  Medication Dose Route Frequency Provider Last Rate Last Dose  . acetaminophen (TYLENOL) tablet 650 mg  650 mg Oral Q4H PRN Odette Horns, MD   650 mg at 10/17/14 1003  . amLODipine (NORVASC) tablet 5 mg  5 mg Oral Daily Venetia Maxon Rama, MD   5 mg at 10/17/14 1003  . enoxaparin (LOVENOX) injection 40 mg  40 mg Subcutaneous Q24H Florencia Reasons, MD   40 mg at 10/16/14 2106  . insulin aspart (novoLOG) injection 0-15 Units  0-15 Units Subcutaneous TID WC Venetia Maxon Rama, MD   3 Units at 10/17/14 0856  . insulin aspart (novoLOG) injection 0-5 Units  0-5 Units Subcutaneous QHS Venetia Maxon Rama, MD   2 Units at 10/16/14 2106  . insulin glargine (LANTUS) injection 20 Units  20 Units Subcutaneous Daily Venetia Maxon Rama, MD   20 Units at 10/17/14 1007    Musculoskeletal: Strength & Muscle  Tone: decreased Gait & Station: unable to stand Patient leans: N/A  Psychiatric Specialty Exam: Physical Exam as per history and physical   ROS generalized weakness, lack of motivation and dizziness. Denied chest pain, shortness of breath, nausea, vomiting and diarrhea No Fever-chills, No Headache, No changes with Vision or hearing, reports vertigo No problems swallowing food or Liquids, No Chest pain, Cough or Shortness of Breath, No Abdominal pain, No Nausea or Vommitting, Bowel movements are regular, No Blood in stool or Urine, No dysuria, No new skin rashes or bruises, No new joints pains-aches,  No new weakness, tingling, numbness in any extremity, No recent weight gain or loss, No polyuria, polydypsia or polyphagia,   A full 10 point Review of Systems was done, except as stated above, all other Review of Systems were negative.  Blood pressure 133/82, pulse 73, temperature 98.1 F (36.7 C), temperature source Oral, resp. rate 18, height _1  (1.854 m), weight 85.9 kg (189 lb 6 oz), SpO2 100 %.Body mass index is 24.99 kg/(m^2).  General Appearance: Guarded  Eye Contact::  Good  Speech:  Blocked and Slow  Volume:  Decreased  Mood:  Depressed  Affect:  Constricted and Depressed  Thought Process:  Irrelevant and Loose  Orientation:  Other:  for first and last name and DOB but not to person and places  Thought  Content:  Paranoid Ideation and Rumination  Suicidal Thoughts:  No  Homicidal Thoughts:  No  Memory:  Immediate;   Poor Recent;   Poor  Judgement:  Impaired  Insight:  Lacking  Psychomotor Activity:  Decreased  Concentration:  Fair  Recall:  [employer] of Knowledge:Poor  Language: Fair  Akathisia:  Negative  Handed:  Right  AIMS (if indicated):     Assets:  Desire for Improvement Financial Resources/Insurance Housing Leisure Time Resilience  ADL's:  Impaired  Cognition: Impaired,  Mild  Sleep:      Medical Decision Making: New problem, with additional  work up planned, Review of Psycho-Social Stressors (1), Review or order clinical lab tests (1), Established Problem, Worsening (2), Review or order medicine tests (1), Review of Medication Regimen & Side Effects (2) and Review of New Medication or Change in Dosage (2)  Treatment Plan Summary: patient has been suffering with significant cognitive deficits history of cardiovascular accident, hyperglycemia, poor medication management, generalized weakness, frequent headaches and dizziness. Patient was not able to manage his own medical care and has no family members to support him. Patient seems like suffering with vascular dementia  Daily contact with patient to assess and evaluate symptoms and progress in treatment and Medication management  Plan:  Patient does not meet criteria for capacity to make his own medical decisions and living arrangements as per my evaluation today  Will recommend referral to the psychiatric social service regarding medical care power of attorney  Patient seems like suffering with vascular dementia without any treatment at this time. May consider medication for dementia like Aricept if appropriate.  Patient does not meet criteria for psychiatric inpatient admission. Supportive therapy provided about ongoing stressors.   Appreciate psychiatric consultation and will sign off at this time Please contact [phone] or [phone] if needs further assistance  Disposition: Patient benefit from out-of-home placement may been skilled nursing facility or assisted living facility as he cannot care for himself and has no known psychosocial support.  Jannah Guardiola,JANARDHAHA R. 10/17/2014 10:38 AM

## 2014-10-17 NOTE — Progress Notes (Signed)
Progress Note   Ruben Williams NID:[id number] DOB: 1945/09/22 DOA: 10-19-14 PCP: PROVIDER NOT IN SYSTEM   Brief Narrative:   Ruben Williams is an 69 y.o. male with a PMH of hypertension, diabetes, dementia, CVA who was admitted 19-Oct-2014 with a chief complaint of dizziness, unsteady gait, and headache. 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  Assessment/Plan:   Principal Problem:   Pituitary macroadenoma with extrasellar extension - Neurology following. No neurosurgical intervention recommended at this time. Non-secretory. - Follow-up ACTH. - FSH WNL at 7. Growth hormone WNL at 0.9. Insulin-like growth factor WNL at 124. Free T4 WNL at 1.04. - Testosterone WNL at 390, prolactin WNL at 10.6, cortisol 12.  Active Problems:   Contact dermatitis - Vistaril twice a day ordered. - Hydrocortisone cream to affected area.    Dizziness / Weakness - PT/OT evaluations performed 10/16/14: SNF recommended.    Type 2 diabetes mellitus with neurological manifestations, uncontrolled - Take 70/30 insulin, 12 units 3 times a day at home. Currently being managed with moderate scale SSI 3 times a day. - CBGs [id number]. Will add 20 units of Lantus to regimen. Hemoglobin A1c 11.4% indicating suboptimal control. - Evaluated by DM coordinator. Increase Lantus to 25 u and add NovoLog TID AC per recommendations.    Essential hypertension, benign - Continue Norvasc.    Dementia - PT/OT evaluations. - Has refused placement despite multiple visits to the healthcare system and concerns about ability to care for himself. - Psychiatry evaluation requested for competency.    Diffuse cerebrovascular disease - Continue aspirin. Needs better risk factor modification including diabetes and hypertension control. - Lipid panel shows LDL of 109, add statin for goal LDL less than 70. Repeat LFTs in 6 weeks.    DVT Prophylaxis - Continue Lovenox.  Code  Status: Full. Family Communication: No family at the bedside. Has a brother and sister in the area, but declines my offer to call them. Disposition Plan: Home vs SNF when stable. Refuses SNF, but has had 4 ED visits in the last 6 months and lacks self-care ability. Psychiatry competency evaluation pending.   IV Access:    Peripheral IV   Procedures and diagnostic studies:   Mr Brain Wo Contrast  10-19-14   CLINICAL DATA:  69 year old male with confusion, dizziness, nausea. Initial encounter.  EXAM: MRI HEAD WITHOUT CONTRAST  TECHNIQUE: Multiplanar, multiecho pulse sequences of the brain and surrounding structures were obtained without intravenous contrast.  COMPARISON:  Head CT without contrast 10/14/2014 and earlier.  FINDINGS: Mildly prominent lateral ventricles, primarily at the atria, but overall ventricular size and cerebral volume are within normal limits for age. There is a mildly angulated appearance of both atria which might indicate perinatal white matter disease.  No restricted diffusion or evidence of acute infarction. Major intracranial vascular flow voids are within normal limits.  Patchy bilateral cerebral white matter T2 and FLAIR hyperintensity. More confluent T2 and FLAIR hyperintensity in the pons. Deep gray matter nuclei are within normal limits for age. No cortical encephalomalacia identified. No acute or chronic intracranial hemorrhage identified. No midline shift. Negative cerebellum and cervicomedullary junction.  The pituitary gland is heterogeneous and lobulated, with abnormal soft tissue extension into the right cavernous sinus (series 9, images 18 and 19). The abnormality involves the central and right gland and encompasses 13 x 15 x 12 mm. The infundibulum is deviated to the left. No suprasellar extension or suprasellar mass effect.  Visualized internal auditory structures appear normal. Trace paranasal sinus mucosal thickening. Mastoids are clear. Visualized orbit soft  tissues are within normal limits. Visualized scalp soft tissues are within normal limits. Normal bone marrow signal. Degenerative changes in the visible cervical spine.  IMPRESSION: 1. 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 No suprasellar extension or suprasellar mass effect. Dedicated pituitary protocol brain MRI without and with contrast would characterize further. 2. No other acute intracranial abnormality identified. 3. Moderate nonspecific white matter and pontine signal changes, most commonly due to chronic small vessel disease.   Electronically Signed   By: Genevie Ann M.D.   On: 10/15/2014 08:28   Mr Kizzie Fantasia Contrast  10/15/2014   CLINICAL DATA:  69 year old male with abnormal pituitary gland on the earlier routine noncontrast brain MRI. Initial encounter.  EXAM: MRI HEAD WITHOUT AND WITH CONTRAST  TECHNIQUE: Multiplanar, multiecho pulse sequences of the brain and surrounding structures were obtained without and with intravenous contrast.  CONTRAST:  45mL MULTIHANCE GADOBENATE DIMEGLUMINE 529 MG/ML IV SOLN  COMPARISON:  Brain MRI without contrast 0726 hours today.  FINDINGS: Pre and postcontrast pituitary protocol imaging of the brain such that brain sequences which would have been redundant with this morning's exam were not performed.  Thin slice pre and post-contrast imaging through the pituitary region confirms heterogeneously enhancing soft tissue extending from the central sella turcica rightward toward the cavernous sinus. On these images this encompasses 14 x 13 x 11 mm. The more homogeneously enhancing pituitary tissue appears deviated to the left along with the infundibulum which appears normal. No suprasellar extension or suprasellar mass effect. Normal hypothalamus. The lesion is inseparable from the medial aspect of the right cavernous sinus but does not surround the right ICA siphon. No  skullbase infiltration is identified.  No other abnormal enhancement is identified about the brain.  IMPRESSION: Confirmed pituitary macroadenoma with suggestion of early involvement of the right cavernous sinus.  14-15 mm lesion without suprasellar extension or mass effect.  No other abnormal enhancement of the brain.   Electronically Signed   By: Genevie Ann M.D.   On: 10/15/2014 14:27     Medical Consultants:    Neurology.  Psychiatry.  Anti-Infectives:    None.  Subjective:   [place] seems to have severe short-term memory deficits. Occasional headache. Complains of left upper arm pain and itching.  Objective:    Filed Vitals:   10/16/14 0520 10/16/14 1400 10/16/14 2101 10/17/14 0632  BP: 156/89 142/71 137/87 133/82  Pulse: 67 84 73 73  Temp: 98.4 F (36.9 C) 98.6 F (37 C) 98.2 F (36.8 C) 98.1 F (36.7 C)  TempSrc: Oral Oral Oral Oral  Resp: 18  18 18   Height:      Weight:      SpO2: 100% 100% 98% 100%    Intake/Output Summary (Last 24 hours) at 10/17/14 0743 Last data filed at 10/17/14 0700  Gross per 24 hour  Intake 1613.75 ml  Output    775 ml  Net 838.75 ml    Exam: Gen:  NAD. Skin: Mild macular eruption left anterior chest and shoulder Cardiovascular:  RRR, No M/R/G Respiratory:  Lungs CTAB Gastrointestinal:  Abdomen soft, NT/ND, + BS Extremities:  No C/E/C   Data Reviewed:    Labs: Basic Metabolic Panel:  Recent Labs Lab 10/14/14 1255 10/15/14 0426 10/15/14 1140 10/16/14 0425  NA 137 134*  --  138  K 4.2 4.2  --  4.1  CL 103 101  --  106  CO2 26 23  --  24  GLUCOSE 290* 405*  --  261*  BUN 18 19  --  12  CREATININE 0.81 1.22 0.78 0.88  CALCIUM 10.1 10.0  --  9.7   GFR Estimated Creatinine Clearance: 90.8 mL/min (by C-G formula based on Cr of 0.88). Liver Function Tests:  Recent Labs Lab 10/14/14 1255 10/16/14 0425  AST 15 13*  ALT 16* 14*  ALKPHOS 88 75  BILITOT 0.7 0.8  PROT 7.0 6.2*  ALBUMIN 4.0 3.5    Coagulation profile  Recent Labs Lab 10/16/14 0425  INR 1.13    CBC:  Recent Labs Lab 10/14/14 1255 10/15/14 0426 10/15/14 1140 10/16/14 0425  WBC 4.7 5.1 6.4 5.6  NEUTROABS 3.2 3.9  --   --   HGB 14.1 14.0 13.1 13.2  HCT 42.6 41.6 40.2  39.9 39.7  MCV 93.4 91.8 92.2 93.4  PLT 264 260 238 241   CBG:  Recent Labs Lab 10/15/14 2059 10/16/14 0735 10/16/14 1209 10/16/14 1717 10/16/14 2055  GLUCAP 233* 216* 277* 155* 203*   Hgb A1c:  Recent Labs  10/15/14 1140  HGBA1C 11.4*   Thyroid function studies:  Recent Labs  10/15/14 1030  TSH 2.553   Anemia work up:  Recent Labs  10/15/14 [place]    Microbiology No results found for this or any previous visit (from the past 240 hour(s)).   Medications:   . amLODipine  5 mg Oral Daily  . enoxaparin (LOVENOX) injection  40 mg Subcutaneous Q24H  . insulin aspart  0-15 Units Subcutaneous TID WC  . insulin aspart  0-5 Units Subcutaneous QHS  . insulin glargine  20 Units Subcutaneous Daily   Continuous Infusions:    Time spent: 35 minutes.  The patient is medically complex and requires high complexity decision making and coordination of care among multiple specialists.    LOS: 2 days   Collin Rengel  [facility] Pager [phone]. If unable to reach me by pager, please call my cell phone at [phone].  *Please refer to [email or web address], password [email or web address] to get updated schedule on who will round on this patient, as hospitalists switch teams weekly. If 7PM-7AM, please contact night-coverage at [email or web address], password [email or web address] for any overnight needs.  10/17/2014, 7:43 AM

## 2014-10-17 NOTE — Progress Notes (Signed)
Pt in bed AAOX3 voice no complaints. No distress noted

## 2014-10-17 NOTE — Progress Notes (Addendum)
NEURO HOSPITALIST PROGRESS NOTE   SUBJECTIVE:                                                                                                                        Offers no new neurological complains. MRI brain tailored to the pituitary gland was personally reviewed and confirms a pituitary macroadenoma measuring 14-15 mm with suggestion of early involvement of the right cavernous sinus but without suprasellar extension or mass effect.  Prolactin, FSH, LH, TSH, cortisol, GH, IGF-1 within normal limits. ACTH pending.  OBJECTIVE:                                                                                                                           Vital signs in last 24 hours: Temp:  [98.1 F (36.7 C)-98.6 F (37 C)] 98.1 F (36.7 C) (06/02 1287) Pulse Rate:  [73-84] 73 (06/02 0632) Resp:  [18] 18 (06/02 0632) BP: (133-142)/(71-87) 133/82 mmHg (06/02 0632) SpO2:  [98 %-100 %] 100 % (06/02 8676)  Intake/Output from previous day: 06/01 0701 - 06/02 0700 In: 1613.8 [P.O.:1320; I.V.:293.8] Out: 775 [Urine:775] Intake/Output this shift:   Nutritional status: Diet heart healthy/carb modified Room service appropriate?: No; Fluid consistency:: Thin  Past Medical History  Diagnosis Date  . Diabetes mellitus   . Hypertension   . Stroke   Physical exam: pleasant male in no apparent distress. . Head: normocephalic. Neck: supple, no bruits, no JVD. Cardiac: no murmurs. Lungs: clear. Abdomen: soft, no tender, no mass. Extremities: no edema. Skin: no rash  Neurologic Exam:  General: Mental Status: Alert, oriented only to month, Doesn't know president's name, recalls 1/3 objects at 5 min, can not perform serial 7/s. Speech fluent without evidence of aphasia. Able to follow 3 step commands without difficulty. Cranial Nerves: II: Discs flat bilaterally; Visual fields grossly normal, pupils equal, round, reactive to light and  accommodation III,IV, VI: ptosis not present, extra-ocular motions intact bilaterally V,VII: smile symmetric, facial light touch sensation normal bilaterally VIII: hearing normal bilaterally IX,X: uvula rises symmetrically XI: bilateral shoulder shrug XII: midline tongue extension without atrophy or fasciculations  Motor: Right :Upper extremity 5/5Left: Upper extremity 5/5 Lower extremity 5/5Lower extremity 5/5 Tone and bulk:normal tone throughout; no atrophy noted Sensory:  Pinprick and light touch intact throughout, bilaterally Deep Tendon Reflexes:  1+ all over Plantars: Right: downgoingLeft: downgoing Cerebellar: normal finger-to-nose, normal heel-to-shin test Gait:  No tested due to multiple leads  Lab Results: Lab Results  Component Value Date/Time   CHOL 177 10/16/2014 04:25 AM   Lipid Panel  Recent Labs  10/16/14 0425  CHOL 177  TRIG 129  HDL 42  CHOLHDL 4.2  VLDL 26  LDLCALC 109*    Studies/Results: Mr Kizzie Fantasia Contrast  10/15/2014   CLINICAL DATA:  69 year old male with abnormal pituitary gland on the earlier routine noncontrast brain MRI. Initial encounter.  EXAM: MRI HEAD WITHOUT AND WITH CONTRAST  TECHNIQUE: Multiplanar, multiecho pulse sequences of the brain and surrounding structures were obtained without and with intravenous contrast.  CONTRAST:  29mL MULTIHANCE GADOBENATE DIMEGLUMINE 529 MG/ML IV SOLN  COMPARISON:  Brain MRI without contrast 0726 hours today.  FINDINGS: Pre and postcontrast pituitary protocol imaging of the brain such that brain sequences which would have been redundant with this morning's exam were not performed.  Thin slice pre and post-contrast imaging through the pituitary region confirms heterogeneously enhancing soft tissue extending from the central sella turcica rightward toward the  cavernous sinus. On these images this encompasses 14 x 13 x 11 mm. The more homogeneously enhancing pituitary tissue appears deviated to the left along with the infundibulum which appears normal. No suprasellar extension or suprasellar mass effect. Normal hypothalamus. The lesion is inseparable from the medial aspect of the right cavernous sinus but does not surround the right ICA siphon. No skullbase infiltration is identified.  No other abnormal enhancement is identified about the brain.  IMPRESSION: Confirmed pituitary macroadenoma with suggestion of early involvement of the right cavernous sinus.  14-15 mm lesion without suprasellar extension or mass effect.  No other abnormal enhancement of the brain.   Electronically Signed   By: Genevie Ann M.D.   On: 10/15/2014 14:27    MEDICATIONS                                                                                                                       Scheduled: . amLODipine  5 mg Oral Daily  . enoxaparin (LOVENOX) injection  40 mg Subcutaneous Q24H  . insulin aspart  0-15 Units Subcutaneous TID WC  . insulin aspart  0-5 Units Subcutaneous QHS  . insulin glargine  20 Units Subcutaneous Daily    ASSESSMENT/PLAN:  69 y/o with HTN, DM, prior stroke that reportedly affected his left side, dementia, and chronic unsteadiness with recurrent falls, found to have evidence of a rather incidental pituitary mass that seems to be consistent with a non secretory pituitary macroadenoma with early involvement of the right cavernous sinus but without suprasellar extension or mass effect. . Further, he is not having evidence of compressive neurological symptoms referable to such lesion. No further inpatient neurology intervention needed. Follow up by neurosurgery as outpaient, but certainly he doesn't appear to require aggressive surgical intervention at this time.   Will sign off.  Dorian Pod, MD [facility] [phone]  10/17/2014, 11:45 AM

## 2014-10-18 DIAGNOSIS — E785 Hyperlipidemia, unspecified: Secondary | ICD-10-CM

## 2014-10-18 DIAGNOSIS — F0391 Unspecified dementia with behavioral disturbance: Secondary | ICD-10-CM

## 2014-10-18 DIAGNOSIS — F03918 Unspecified dementia, unspecified severity, with other behavioral disturbance: Secondary | ICD-10-CM | POA: Diagnosis present

## 2014-10-18 LAB — GLUCOSE, CAPILLARY
GLUCOSE-CAPILLARY: 160 mg/dL — AB (ref 65–99)
GLUCOSE-CAPILLARY: 134 mg/dL — AB (ref 65–99)
Glucose-Capillary: 206 mg/dL — ABNORMAL HIGH (ref 65–99)
Glucose-Capillary: 192 mg/dL — ABNORMAL HIGH (ref 65–99)

## 2014-10-18 MED ORDER — INSULIN ASPART 100 UNIT/ML ~~LOC~~ SOLN
5.0000 [IU] | Freq: Three times a day (TID) | SUBCUTANEOUS | Status: DC
  Administered 2014-10-18 (×3): 5 [IU] via SUBCUTANEOUS

## 2014-10-18 MED ORDER — RISPERIDONE 1 MG PO TBDP
1.0000 mg | ORAL_TABLET | Freq: Every day | ORAL | Status: DC
  Administered 2014-10-18 – 2014-10-24 (×7): 1 mg via ORAL
  Filled 2014-10-18 (×10): qty 1

## 2014-10-18 NOTE — Progress Notes (Signed)
Progress Note   Ruben Williams VQM:[id number] DOB: 1945/12/22 DOA: 10/15/2014 PCP: PROVIDER NOT IN SYSTEM   Brief Narrative:   Ruben Williams is an 69 y.o. male with a PMH of hypertension, diabetes, dementia, CVA who was admitted 10/15/14 with a chief complaint of dizziness, unsteady gait, and headache. 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  Assessment/Plan:   Principal Problem:   Pituitary macroadenoma with extrasellar extension - Neurology following. No neurosurgical intervention recommended at this time. Non-secretory. - All hormones within the normal range.  Active Problems:   Contact dermatitis - Continue Vistaril twice a day. - Continue Hydrocortisone cream to affected area.    Dizziness / Weakness - PT/OT evaluations performed 10/16/14: SNF recommended.    Type 2 diabetes mellitus with neurological manifestations, uncontrolled - Take 70/30 insulin, 12 units 3 times a day at home.  - Currently being managed with Lantus 25 units daily, moderate scale SSI and 4 units of NovoLog meal coverage. - CBGs 166-310. Will increase meal coverage to 5 units.     Essential hypertension, benign - Continue Norvasc.    Dementia with behavioral disturbance - PT/OT evaluations. - Has refused placement despite multiple visits to the healthcare system and concerns about ability to care for himself. - Lacks capacity per psychiatric evaluation done 10/17/14. Social worker to address disposition with POA. - Continues to express paranoid ideations, and cannot recall why he is here despite explaining this to him multiple times. - We'll start low-dose Risperdal to address agitation/paranoia.    Diffuse cerebrovascular disease - Continue aspirin. Needs better risk factor modification including diabetes and hypertension control. - Lipid panel shows LDL of 109, add statin for goal LDL less than 70. Repeat LFTs in 6 weeks.    DVT  Prophylaxis - Continue Lovenox.  Code Status: Full. Family Communication: No family at the bedside. No family contact information listed in chart. Disposition Plan: For placement. Unsafe to discharge home due to lack of ability to care for himself. Likely will need court appointed POA.   IV Access:    Peripheral IV   Procedures and diagnostic studies:   No results found.   Medical Consultants:    Neurology.  Psychiatry.  Anti-Infectives:    None.  Subjective:   Ruben Williams is angry and agitated. Continues to insist that no one will tell him why he is here despite my explaining this to him multiple times. Seems unable to recall any details about prior interactions. Thinks the nursing staff for mistreating him.  Objective:    Filed Vitals:   10/17/14 0632 10/17/14 1401 10/17/14 2105 10/18/14 0532  BP: 133/82 117/75 114/69 132/85  Pulse: 73 81 80 76  Temp: 98.1 F (36.7 C) 98.4 F (36.9 C) 98.2 F (36.8 C) 98.1 F (36.7 C)  TempSrc: Oral Oral Oral Oral  Resp: 18 20 18 18   Height:      Weight:      SpO2: 100% 100% 100% 100%    Intake/Output Summary (Last 24 hours) at 10/18/14 0756 Last data filed at 10/17/14 1300  Gross per 24 hour  Intake    360 ml  Output      0 ml  Net    360 ml    Exam: Gen:  NAD. Skin: Mild macular eruption left anterior chest and shoulder Cardiovascular:  RRR, No M/R/G Respiratory:  Lungs CTAB Gastrointestinal:  Abdomen soft, NT/ND, + BS Extremities:  No C/E/C  Data Reviewed:    Labs: Basic Metabolic Panel:  Recent Labs Lab 10/14/14 1255 10/15/14 0426 10/15/14 1140 10/16/14 0425  NA 137 134*  --  138  K 4.2 4.2  --  4.1  CL 103 101  --  106  CO2 26 23  --  24  GLUCOSE 290* 405*  --  261*  BUN 18 19  --  12  CREATININE 0.81 1.22 0.78 0.88  CALCIUM 10.1 10.0  --  9.7   GFR Estimated Creatinine Clearance: 90.8 mL/min (by C-G formula based on Cr of 0.88). Liver Function Tests:  Recent Labs Lab  10/14/14 1255 10/16/14 0425  AST 15 13*  ALT 16* 14*  ALKPHOS 88 75  BILITOT 0.7 0.8  PROT 7.0 6.2*  ALBUMIN 4.0 3.5   Coagulation profile  Recent Labs Lab 10/16/14 0425  INR 1.13    CBC:  Recent Labs Lab 10/14/14 1255 10/15/14 0426 10/15/14 1140 10/16/14 0425  WBC 4.7 5.1 6.4 5.6  NEUTROABS 3.2 3.9  --   --   HGB 14.1 14.0 13.1 13.2  HCT 42.6 41.6 40.2  39.9 39.7  MCV 93.4 91.8 92.2 93.4  PLT 264 260 238 241   CBG:  Recent Labs Lab 10/16/14 2055 10/17/14 0826 10/17/14 1152 10/17/14 1714 10/17/14 2104  GLUCAP 203* 166* 310* 150* 310*   Hgb A1c:  Recent Labs  10/15/14 1140  HGBA1C 11.4*   Thyroid function studies:  Recent Labs  10/15/14 1030  TSH 2.553   Anemia work up:  Recent Labs  10/15/14 [place]    Microbiology No results found for this or any previous visit (from the past 240 hour(s)).   Medications:   . amLODipine  5 mg Oral Daily  . atorvastatin  20 mg Oral q1800  . enoxaparin (LOVENOX) injection  40 mg Subcutaneous Q24H  . hydrocortisone cream   Topical TID  . hydrOXYzine  25 mg Oral BID  . insulin aspart  0-15 Units Subcutaneous TID WC  . insulin aspart  0-5 Units Subcutaneous QHS  . insulin aspart  4 Units Subcutaneous TID WC  . insulin glargine  25 Units Subcutaneous Daily   Continuous Infusions:    Time spent: 35 minutes.  The patient is medically complex and requires high complexity decision making and coordination of care among multiple specialists.    LOS: 3 days   [facility] Pager [phone]. If unable to reach me by pager, please call my cell phone at [phone].  *Please refer to [email or web address], password [email or web address] to get updated schedule on who will round on this patient, as hospitalists switch teams weekly. If 7PM-7AM, please contact night-coverage at [email or web address], password [email or web address] for any overnight needs.  10/18/2014, 7:56 AM

## 2014-10-18 NOTE — Care Management Note (Signed)
Case Management Note  Patient Details  Name: ABRAHM MANCIA MRN: [id number] Date of Birth: [birth year]  Subjective/Objective:    CSW following for SNF, & petition.                Action/Plan:d/c plan SNF.   Expected Discharge Date:   (unknown)               Expected Discharge Plan:  [place] (Await PT recommendations.)  In-House Referral:  Clinical Social Work  Discharge planning Services  CM Consult (Received call from [place] me of unsafe home.[facility] involved,[facility]  concerns.)  Post Acute Care Choice:    Choice offered to:     DME Arranged:    DME Agency:     HH Arranged:    HH Agency:     Status of Service:  In process, will continue to follow  Medicare Important Message Given:  Yes Date Medicare IM Given:  10/18/14 Medicare IM give by:  Dessa Phi Date Additional Medicare IM Given:    Additional Medicare Important Message give by:     If discussed at [place] of Stay Meetings, dates discussed:    Additional Comments:  Dessa Phi, RN 10/18/2014, 1:18 PM

## 2014-10-18 NOTE — Progress Notes (Signed)
Occupational Therapy Treatment Patient Details Name: Ruben Williams MRN: [id number] DOB: March 14, 1946 Today's Date: 10/18/2014    History of present illness Ruben Williams is an 69 y.o. male with a past medical history significant for HTN, DM, stroke that reportedly affected is left side, dementia, and recurrent falls, blindness right eye, brought in with complains of dizziness, falls, unsteadiness.  CT + pituitary mass   OT comments  Pt becoming increasingly agitated during session. Pt wanting to wash up but as pt was getting up to EOB, he became upset all of a sudden that therapy "just showed up" without notice. Attempted to explain that there are not set time for therapy session. Though pt still grumbling and frustrated, pt agreeable to continue with washing up. Pt later upset about therapy trying to give safety recommendations and also that he needed more soap. Obtained more soap for pt to use. Called for nursing and explained increased agitation level. Recommend SNF.    Follow Up Recommendations  SNF;Supervision/Assistance - 24 hour    Equipment Recommendations  None recommended by OT    Recommendations for Other Services      Precautions / Restrictions Precautions Precautions: Fall Precaution Comments: blindness  R eye       Mobility Bed Mobility Overal bed mobility: Modified Independent                Transfers Overall transfer level: Needs assistance Equipment used: None   Sit to Stand: Min guard         General transfer comment: pt stood very quickly and stated he was dizzy. min guard for safety.     Balance                                   ADL       Grooming: Wash/dry face;Set up;Sitting;Supervision/safety                   Toilet Transfer: Minimal assistance;Ambulation   Toileting- Clothing Manipulation and Hygiene: Min guard;Sit to/from stand         General ADL Comments: Pt stating he asked about doing a shower earlier  today but waiting on whether or not he is allowed to shower. Asked if pt would like to wash up at the sink in the bathroom and pt agreeable. Once to EOB pt then starting to get somewhat agitated, stating, "you think you can just come and I can pop right up out of this bed. whey werent you here earlier when I wanted to bathe." Attempted to calm pt but he was still agitated that therapy "just showed up." Pt then transferred into bathroom with min assist and no device but was alittle unsteady and stating he felt a little dizzy also. Pulled up BSC for pt to sit on for bath. Pt becoming agitated again about various things  including not having enough soap available. This therapist obtained more soap and pt also shutting bathroom door and locking door despite cues not to. Called for nursing to come to room for extra assist as pt becoming increasingly agitated. Pt with decreased safety awareness, draggging washcloth over sink edge without wringing it out and spilling water on the floor. Pt didnt seem aware of the fall risk of water on the floor until OT pointed this out. pt also attempting to wipe water off floor with the gown on the floor and sliding his foot back and  forth. Assisted pt back to bed and pt more calm.       Vision                     Perception     Praxis      Cognition   Behavior During Therapy: Agitated Overall Cognitive Status: No family/caregiver present to determine baseline cognitive functioning                       Extremity/Trunk Assessment               Exercises     Shoulder Instructions       General Comments      Pertinent Vitals/ Pain       Pain Assessment: No/denies pain  Home Living                                          Prior Functioning/Environment              Frequency Min 2X/week     Progress Toward Goals  OT Goals(current goals can now be found in the care plan section)  Progress towards OT goals:  Progressing toward goals     Plan Discharge plan remains appropriate    Co-evaluation                 End of Session     Activity Tolerance Other (comment) (pt agitated during session)   Patient Left in bed;with call bell/phone within reach;with bed alarm set   Nurse Communication          Time: 6195-0932 OT Time Calculation (min): 28 min  Charges: OT General Charges $OT Visit: 1 Procedure OT Treatments $Self Care/Home Management : 8-22 mins $Therapeutic Activity: 8-22 mins  Jules Schick  [phone] 10/18/2014, 12:11 PM

## 2014-10-18 NOTE — Clinical Social Work Note (Signed)
CSW received release letter from [facility] ([facility]) and sent pt's psychiatric assessment and PT evaluation via fax to Ernest Pine who is working pt's case  CSW will put copy of release in pt's shadow chart  .Dede Query, LCSW [facility] Clinical Social Worker - Weekend Coverage cell #: [phone]

## 2014-10-18 NOTE — Clinical Social Work Note (Signed)
CSW received a call back from [place] worker Ernest Pine [phone]) to discuss pt's psychiatric consult  CSW provided fax number for [facility] worker to fax release so psychiatric assessment can be faxed over to her.  Once received she can petition the courts to obtain POA.  [facility] worker had no relative information on pt.  CSW called pt's emergency contact and left message for a return call to assess if any family or friends are helping make decisions for pt  CSW will follow up with placement once all availalble contacts for pt are explored  .Dede Query, LCSW [facility] Clinical Social Worker - Weekend Coverage cell #: [phone]

## 2014-10-18 NOTE — Progress Notes (Signed)
Physical Therapy Treatment Patient Details Name: Ruben Williams MRN: [id number] DOB: Sep 27, 1945 Today's Date: 10/18/2014    History of Present Illness Ruben Williams is an 69 y.o. male with a past medical history significant for HTN, DM, stroke that reportedly affected is left side, dementia, and recurrent falls, blindness right eye, brought in with complains of dizziness, falls, unsteadiness.  CT + pituitary mass    PT Comments    Participated well with session. Tolerated increased distance well on today. Still with cognitive issues. Continue to recommend SNF.  Follow Up Recommendations  SNF;Supervision/Assistance - 24 hour (after SNF may need transition to ALF)     Equipment Recommendations  None recommended by PT    Recommendations for Other Services       Precautions / Restrictions Precautions Precautions: Fall Precaution Comments: blindness  R eye    Mobility  Bed Mobility Overal bed mobility: Modified Independent                Transfers Overall transfer level: Needs assistance Equipment used: None Transfers: Sit to/from Stand Sit to Stand: Supervision         General transfer comment: supervision for safety  Ambulation/Gait Ambulation/Gait assistance: Min assist Ambulation Distance (Feet): 400 Feet Assistive device:  (hallway handrail intermittently)       General Gait Details: unsteady at times requiring small amount of assist intermittently to stabilize. pt also occasionally held onto hallway handrail. tolerated distance fairly well.    Stairs            Wheelchair Mobility    Modified Rankin (Stroke Patients Only)       Balance           Standing balance support: During functional activity;No upper extremity supported Standing balance-Leahy Scale: Good                      Cognition Arousal/Alertness: Awake/alert Behavior During Therapy: WFL for tasks assessed/performed Overall Cognitive Status: No family/caregiver  present to determine baseline cognitive functioning Area of Impairment: Safety/judgement Orientation Level: Situation;Disoriented to       Safety/Judgement: Decreased awareness of safety          Exercises      General Comments        Pertinent Vitals/Pain Pain Assessment: Faces Faces Pain Scale: Hurts a little bit Pain Location: R medial ankle area with ambulation Pain Descriptors / Indicators: Sore Pain Intervention(s): Monitored during session    Home Living                      Prior Function            PT Goals (current goals can now be found in the care plan section) Progress towards PT goals: Progressing toward goals    Frequency  Min 3X/week    PT Plan Current plan remains appropriate    Co-evaluation             End of Session Equipment Utilized During Treatment: Gait belt Activity Tolerance: Patient tolerated treatment well Patient left: in bed;with call bell/phone within reach;with bed alarm set     Time: 1400-1420 PT Time Calculation (min) (ACUTE ONLY): 20 min  Charges:  $Gait Training: 8-22 mins                    G Codes:      Weston Anna, MPT Pager: [phone]

## 2014-10-19 LAB — GLUCOSE, CAPILLARY
GLUCOSE-CAPILLARY: 154 mg/dL — AB (ref 65–99)
GLUCOSE-CAPILLARY: 123 mg/dL — AB (ref 65–99)
GLUCOSE-CAPILLARY: 176 mg/dL — AB (ref 65–99)
Glucose-Capillary: 180 mg/dL — ABNORMAL HIGH (ref 65–99)

## 2014-10-19 MED ORDER — INSULIN ASPART 100 UNIT/ML ~~LOC~~ SOLN
6.0000 [IU] | Freq: Three times a day (TID) | SUBCUTANEOUS | Status: DC
  Administered 2014-10-19 (×3): 6 [IU] via SUBCUTANEOUS

## 2014-10-19 NOTE — Progress Notes (Signed)
Progress Note   Ruben Williams ZCH:[id number] DOB: 12-18-1945 DOA: 10/15/2014 PCP: PROVIDER NOT IN SYSTEM   Brief Narrative:   Ruben Williams is an 69 y.o. male with a PMH of hypertension, diabetes, dementia, CVA who was admitted 10/15/14 with a chief complaint of dizziness, unsteady gait, and headache. 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  Assessment/Plan:   Principal Problem:   Pituitary macroadenoma with extrasellar extension - Neurology following. No neurosurgical intervention recommended at this time. Non-secretory. - All hormones within the normal range.  Active Problems:   Contact dermatitis - Continue Vistaril twice a day. - Continue Hydrocortisone cream to affected area.    Dizziness / Weakness - PT/OT evaluations performed 10/16/14: SNF recommended.    Type 2 diabetes mellitus with neurological manifestations, uncontrolled - Take 70/30 insulin, 12 units 3 times a day at home.  - Currently being managed with Lantus 25 units daily, moderate scale SSI and 5 units of NovoLog meal coverage. - CBGs 134-206. Will increase meal coverage to 6 units.     Essential hypertension, benign - Continue Norvasc.    Dementia with behavioral disturbance - PT/OT evaluations. - Has refused placement despite multiple visits to the healthcare system and concerns about ability to care for himself. - Lacks capacity per psychiatric evaluation done 10/17/14. Social worker to address disposition with POA. - Continues to express paranoid ideations, and cannot recall why he is here despite explaining this to him multiple times. - Low dose Risperdal started 10/18/14 to address agitation/paranoia.    Diffuse cerebrovascular disease - Continue aspirin. Needs better risk factor modification including diabetes and hypertension control. - Lipid panel shows LDL of 109, add statin for goal LDL less than 70. Repeat LFTs in 6 weeks.    DVT  Prophylaxis - Continue Lovenox.  Code Status: Full. Family Communication: No family at the bedside. No family contact information listed in chart. Disposition Plan: For placement. Unsafe to discharge home due to lack of ability to care for himself. Likely will need court appointed POA.   IV Access:    Peripheral IV   Procedures and diagnostic studies:   No results found.   Medical Consultants:    Neurology.  Psychiatry.  Anti-Infectives:    None.  Subjective:   Ruben Williams is calmer today.  He says his itchy skin is "much better".  Had a headache earlier today, better now.  Appetite OK.  No BM today, had a small BM yesterday.  Objective:    Filed Vitals:   10/18/14 0532 10/18/14 1407 10/18/14 2132 10/19/14 0458  BP: 132/85 119/82 110/68 122/75  Pulse: 76 99 86 66  Temp: 98.1 F (36.7 C) 98.1 F (36.7 C) 98.1 F (36.7 C) 97.7 F (36.5 C)  TempSrc: Oral Oral Oral Oral  Resp: 18 20 20 18   Height:      Weight:      SpO2: 100% 100% 100% 100%    Intake/Output Summary (Last 24 hours) at 10/19/14 0757 Last data filed at 10/19/14 0634  Gross per 24 hour  Intake    720 ml  Output    225 ml  Net    495 ml    Exam: Gen:  NAD. Cardiovascular:  RRR, No M/R/G Respiratory:  Lungs CTAB Gastrointestinal:  Abdomen soft, NT/ND, + BS Extremities:  No C/E/C   Data Reviewed:    Labs: Basic Metabolic Panel:  Recent Labs Lab 10/14/14 1255 10/15/14 0426  10/15/14 1140 10/16/14 0425  NA 137 134*  --  138  K 4.2 4.2  --  4.1  CL 103 101  --  106  CO2 26 23  --  24  GLUCOSE 290* 405*  --  261*  BUN 18 19  --  12  CREATININE 0.81 1.22 0.78 0.88  CALCIUM 10.1 10.0  --  9.7   GFR Estimated Creatinine Clearance: 90.8 mL/min (by C-G formula based on Cr of 0.88). Liver Function Tests:  Recent Labs Lab 10/14/14 1255 10/16/14 0425  AST 15 13*  ALT 16* 14*  ALKPHOS 88 75  BILITOT 0.7 0.8  PROT 7.0 6.2*  ALBUMIN 4.0 3.5   Coagulation  profile  Recent Labs Lab 10/16/14 0425  INR 1.13    CBC:  Recent Labs Lab 10/14/14 1255 10/15/14 0426 10/15/14 1140 10/16/14 0425  WBC 4.7 5.1 6.4 5.6  NEUTROABS 3.2 3.9  --   --   HGB 14.1 14.0 13.1 13.2  HCT 42.6 41.6 40.2  39.9 39.7  MCV 93.4 91.8 92.2 93.4  PLT 264 260 238 241   CBG:  Recent Labs Lab 10/18/14 0750 10/18/14 1157 10/18/14 1646 10/18/14 2131 10/19/14 0728  GLUCAP 206* 160* 134* 192* 154*    Medications:   . amLODipine  5 mg Oral Daily  . atorvastatin  20 mg Oral q1800  . enoxaparin (LOVENOX) injection  40 mg Subcutaneous Q24H  . hydrocortisone cream   Topical TID  . hydrOXYzine  25 mg Oral BID  . insulin aspart  0-15 Units Subcutaneous TID WC  . insulin aspart  0-5 Units Subcutaneous QHS  . insulin aspart  5 Units Subcutaneous TID WC  . insulin glargine  25 Units Subcutaneous Daily  . risperiDONE  1 mg Oral QHS   Continuous Infusions:    Time spent: 25 minutes.    LOS: 4 days   [place] [facility] Pager [phone]. If unable to reach me by pager, please call my cell phone at [phone].  *Please refer to [email or web address], password [email or web address] to get updated schedule on who will round on this patient, as hospitalists switch teams weekly. If 7PM-7AM, please contact night-coverage at [email or web address], password [email or web address] for any overnight needs.  10/19/2014, 7:57 AM

## 2014-10-20 LAB — GLUCOSE, CAPILLARY
Glucose-Capillary: 294 mg/dL — ABNORMAL HIGH (ref 65–99)
Glucose-Capillary: 151 mg/dL — ABNORMAL HIGH (ref 65–99)
Glucose-Capillary: 360 mg/dL — ABNORMAL HIGH (ref 65–99)

## 2014-10-20 MED ORDER — OXYCODONE-ACETAMINOPHEN 5-325 MG PO TABS
1.0000 | ORAL_TABLET | Freq: Two times a day (BID) | ORAL | Status: DC
  Administered 2014-10-20 – 2014-10-25 (×11): 1 via ORAL
  Filled 2014-10-20 (×14): qty 1

## 2014-10-20 MED ORDER — INSULIN ASPART 100 UNIT/ML ~~LOC~~ SOLN
0.0000 [IU] | Freq: Three times a day (TID) | SUBCUTANEOUS | Status: DC
  Administered 2014-10-20: 11 [IU] via SUBCUTANEOUS
  Administered 2014-10-20 – 2014-10-21 (×2): 4 [IU] via SUBCUTANEOUS
  Administered 2014-10-22: 3 [IU] via SUBCUTANEOUS
  Administered 2014-10-23 (×2): 4 [IU] via SUBCUTANEOUS
  Administered 2014-10-23: 15 [IU] via SUBCUTANEOUS
  Administered 2014-10-24 (×2): 3 [IU] via SUBCUTANEOUS
  Administered 2014-10-24 – 2014-10-25 (×2): 4 [IU] via SUBCUTANEOUS
  Administered 2014-10-25: 7 [IU] via SUBCUTANEOUS

## 2014-10-20 MED ORDER — INSULIN ASPART 100 UNIT/ML ~~LOC~~ SOLN
0.0000 [IU] | Freq: Every day | SUBCUTANEOUS | Status: DC
  Administered 2014-10-20: 5 [IU] via SUBCUTANEOUS
  Administered 2014-10-21: 2 [IU] via SUBCUTANEOUS
  Administered 2014-10-22: 4 [IU] via SUBCUTANEOUS
  Administered 2014-10-24: 2 [IU] via SUBCUTANEOUS

## 2014-10-20 MED ORDER — INSULIN ASPART 100 UNIT/ML ~~LOC~~ SOLN
6.0000 [IU] | Freq: Three times a day (TID) | SUBCUTANEOUS | Status: DC
  Administered 2014-10-20 – 2014-10-21 (×3): 6 [IU] via SUBCUTANEOUS

## 2014-10-20 MED ORDER — OXYCODONE HCL ER 10 MG PO T12A: 10.0000 mg | EXTENDED_RELEASE_TABLET | Freq: Two times a day (BID) | ORAL | Status: DC

## 2014-10-20 NOTE — Progress Notes (Signed)
Progress Note   Ruben Williams [id number]:[id number] DOB: January 20, 1946 DOA: 10/15/2014 PCP: PROVIDER NOT IN SYSTEM   Brief Narrative:   Ruben Williams is an 69 y.o. male with a PMH of hypertension, diabetes, dementia, CVA who was admitted 10/15/14 with a chief complaint of dizziness, unsteady gait, and headache. 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  Assessment/Plan:   Principal Problem:   Pituitary macroadenoma with extrasellar extension / Headache - Neurology following. No neurosurgical intervention recommended at this time. Non-secretory. - All hormones within the normal range. - Percocet 1 tablet BID ordered for headache.  Active Problems:   Contact dermatitis - Continue Vistaril twice a day. Pruritus improved. - Continue Hydrocortisone cream to affected area.    Dizziness / Weakness - PT/OT evaluations performed 10/16/14: SNF recommended.    Type 2 diabetes mellitus with neurological manifestations, uncontrolled - Take 70/30 insulin, 12 units 3 times a day at home.  - Currently being managed with Lantus 25 units daily, moderate scale SSI and 6 units of NovoLog meal coverage. - CBGs 123-294. Change SSI coverage to resistant scale.     Essential hypertension, benign - Continue Norvasc.    Dementia with behavioral disturbance - PT/OT evaluations performed, SNF recommended. - Lacks capacity per psychiatric evaluation done 10/17/14. Social worker to address disposition with POA. - Low dose Risperdal started 10/18/14 to address agitation/paranoia.  Mood much more stable, less agitation.    Diffuse cerebrovascular disease - Continue aspirin. Needs better risk factor modification including diabetes and hypertension control. - Lipid panel shows LDL of 109, statin started to achieve goal LDL less than 70. Repeat LFTs in 6 weeks.    DVT Prophylaxis - Continue Lovenox.  Code Status: Full. Family Communication: No family at the  bedside. No family contact information listed in chart. Disposition Plan: For placement. Unsafe to discharge home due to lack of ability to care for himself. Likely will need court appointed POA.   IV Access:    Peripheral IV   Procedures and diagnostic studies:   No results found.   Medical Consultants:    Neurology.  Psychiatry.  Anti-Infectives:    None.  Subjective:   Morven is calm today.  Pleasant.  Still has a headache.  No dyspnea, N/V.  Objective:    Filed Vitals:   10/19/14 0458 10/19/14 1425 10/19/14 2125 10/20/14 0554  BP: 122/75 118/70 123/75 145/79  Pulse: 66 74 98 72  Temp: 97.7 F (36.5 C) 98 F (36.7 C) 97.8 F (36.6 C) 97.5 F (36.4 C)  TempSrc: Oral Oral Oral Oral  Resp: 18 18 18 18   Height:      Weight:      SpO2: 100% 100% 100% 100%    Intake/Output Summary (Last 24 hours) at 10/20/14 0805 Last data filed at 10/20/14 0700  Gross per 24 hour  Intake    600 ml  Output    400 ml  Net    200 ml    Exam: Gen:  NAD. Cardiovascular:  RRR, No M/R/G Respiratory:  Lungs CTAB Gastrointestinal:  Abdomen soft, NT/ND, + BS Extremities:  No C/E/C   Data Reviewed:    Labs: Basic Metabolic Panel:  Recent Labs Lab 10/14/14 1255 10/15/14 0426 10/15/14 1140 10/16/14 0425  NA 137 134*  --  138  K 4.2 4.2  --  4.1  CL 103 101  --  106  CO2 26 23  --  24  GLUCOSE 290* 405*  --  261*  BUN 18 19  --  12  CREATININE 0.81 1.22 0.78 0.88  CALCIUM 10.1 10.0  --  9.7   GFR Estimated Creatinine Clearance: 90.8 mL/min (by C-G formula based on Cr of 0.88). Liver Function Tests:  Recent Labs Lab 10/14/14 1255 10/16/14 0425  AST 15 13*  ALT 16* 14*  ALKPHOS 88 75  BILITOT 0.7 0.8  PROT 7.0 6.2*  ALBUMIN 4.0 3.5   Coagulation profile  Recent Labs Lab 10/16/14 0425  INR 1.13    CBC:  Recent Labs Lab 10/14/14 1255 10/15/14 0426 10/15/14 1140 10/16/14 0425  WBC 4.7 5.1 6.4 5.6  NEUTROABS 3.2 3.9  --   --    HGB 14.1 14.0 13.1 13.2  HCT 42.6 41.6 40.2  39.9 39.7  MCV 93.4 91.8 92.2 93.4  PLT 264 260 238 241   CBG:  Recent Labs Lab 10/19/14 0728 10/19/14 1213 10/19/14 1638 10/19/14 2124 10/20/14 0740  GLUCAP 154* 180* 123* 176* 294*    Medications:   . amLODipine  5 mg Oral Daily  . atorvastatin  20 mg Oral q1800  . enoxaparin (LOVENOX) injection  40 mg Subcutaneous Q24H  . hydrocortisone cream   Topical TID  . hydrOXYzine  25 mg Oral BID  . insulin aspart  0-15 Units Subcutaneous TID WC  . insulin aspart  0-5 Units Subcutaneous QHS  . insulin aspart  6 Units Subcutaneous TID WC  . insulin glargine  25 Units Subcutaneous Daily  . risperiDONE  1 mg Oral QHS   Continuous Infusions:    Time spent: 25 minutes.    LOS: 5 days   [place] [facility] Pager [phone]. If unable to reach me by pager, please call my cell phone at [phone].  *Please refer to [email or web address], password [email or web address] to get updated schedule on who will round on this patient, as hospitalists switch teams weekly. If 7PM-7AM, please contact night-coverage at [email or web address], password [email or web address] for any overnight needs.  10/20/2014, 8:05 AM

## 2014-10-21 DIAGNOSIS — F22 Delusional disorders: Secondary | ICD-10-CM | POA: Diagnosis present

## 2014-10-21 DIAGNOSIS — R451 Restlessness and agitation: Secondary | ICD-10-CM | POA: Diagnosis present

## 2014-10-21 LAB — GLUCOSE, CAPILLARY
Glucose-Capillary: 106 mg/dL — ABNORMAL HIGH (ref 65–99)
Glucose-Capillary: 127 mg/dL — ABNORMAL HIGH (ref 65–99)
Glucose-Capillary: 165 mg/dL — ABNORMAL HIGH (ref 65–99)
Glucose-Capillary: 216 mg/dL — ABNORMAL HIGH (ref 65–99)

## 2014-10-21 MED ORDER — INSULIN ASPART 100 UNIT/ML ~~LOC~~ SOLN
7.0000 [IU] | Freq: Three times a day (TID) | SUBCUTANEOUS | Status: DC
  Administered 2014-10-21 – 2014-10-25 (×10): 7 [IU] via SUBCUTANEOUS

## 2014-10-21 MED ORDER — HALOPERIDOL LACTATE 5 MG/ML IJ SOLN
INTRAMUSCULAR | Status: AC
  Administered 2014-10-21: 10:00:00
  Filled 2014-10-21: qty 1

## 2014-10-21 MED ORDER — HALOPERIDOL LACTATE 5 MG/ML IJ SOLN
5.0000 mg | Freq: Once | INTRAMUSCULAR | Status: AC
  Administered 2014-10-21: 5 mg via INTRAMUSCULAR

## 2014-10-21 MED ORDER — HALOPERIDOL LACTATE 5 MG/ML IJ SOLN
5.0000 mg | Freq: Four times a day (QID) | INTRAMUSCULAR | Status: DC | PRN
Start: 2014-10-21 — End: 2014-10-25
  Administered 2014-10-22: 5 mg via INTRAVENOUS
  Filled 2014-10-21 (×3): qty 1

## 2014-10-21 MED ORDER — BENZTROPINE MESYLATE 1 MG/ML IJ SOLN
1.0000 mg | Freq: Once | INTRAMUSCULAR | Status: DC
  Filled 2014-10-21: qty 1

## 2014-10-21 NOTE — Progress Notes (Signed)
At about 09:15 upon entering patient's room found patient to be very lethargic, sitting in chair, just finished breakfast. Attempted to give patient's AM meds but he refused. Became verbally and physically aggressive, pushed tray table out from in front of him, put his shoes on and stated he was leaving. Ambulating down hallway with rolling walker, legs buckled beneath him and staff lowered him to the floor. Still aggressive, DON, security and Dr. Rockne Menghini notified. Patient was given haldol 5mg  IM, put in wheelchair and taken back to his room. Much calmer now. Will continue to monitor.

## 2014-10-21 NOTE — Progress Notes (Signed)
Progress Note   Ruben Williams ZDG:[id number] DOB: August 12, 1945 DOA: 10/15/2014 PCP: PROVIDER NOT IN SYSTEM   Brief Narrative:   Ruben Williams is an 69 y.o. male with a PMH of hypertension, diabetes, dementia, CVA who was admitted 10/15/14 with a chief complaint of dizziness, unsteady gait, and headache. 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  Assessment/Plan:   Principal Problem:   Pituitary macroadenoma with extrasellar extension / Headache - Neurology following. No neurosurgical intervention recommended at this time. Non-secretory. - All hormones within the normal range. - Percocet 1 tablet BID ordered for headache.  Active Problems:   Contact dermatitis - Continue Vistaril twice a day. Pruritus improved. - Continue Hydrocortisone cream to affected area.    Dizziness / Weakness - PT/OT evaluations performed 10/16/14: SNF recommended.    Type 2 diabetes mellitus with neurological manifestations, uncontrolled - Take 70/30 insulin, 12 units 3 times a day at home.  - Currently being managed with Lantus 25 units daily, resistant scale SSI and 6 units of NovoLog meal coverage. - CBGs 127-360. Increase meal coverage to 7 units. Has been refusing insulin at times.    Essential hypertension, benign - Continue Norvasc.    Dementia with behavioral disturbance - PT/OT evaluations performed, SNF recommended. - Lacks capacity per psychiatric evaluation done 10/17/14. Social worker to address disposition with POA. - Low dose Risperdal started 10/18/14 to address agitation/paranoia.  Calm until today. - Agitated with paranoid delusions, wanting staff to call police claiming he was "pushed", accusing people of laughing at him. - Haldol 5 mg IV x 1 given, psych re-consulted.    Diffuse cerebrovascular disease - Continue aspirin. Needs better risk factor modification including diabetes and hypertension control. - Lipid panel shows LDL of  109, statin started to achieve goal LDL less than 70. Repeat LFTs in 6 weeks.    DVT Prophylaxis - Continue Lovenox.  Code Status: Full. Family Communication: No family at the bedside. No family contact information listed in chart. Disposition Plan: For placement. Unsafe to discharge home due to lack of ability to care for himself. Likely will need court appointed POA.   IV Access:    Peripheral IV   Procedures and diagnostic studies:   No results found.   Medical Consultants:    Neurology.  Psychiatry.  Anti-Infectives:    None.  Subjective:   Cataract is agitated, lying on the floor in the hallway.  Called by RN secondary to severe agitation, combativeness with staff, threatening security guards, assisted to floor.  Initially refused to talk with me, but with calm reassurance, gradually opened eyes and was able to verbalize.  Didn't sleep well last night, says someone was screaming all night.  Accusing nursing staff of mistreating him, and PT of laughing at him.  Says he wants to leave.  With much reassurance and calm reflection, patient consented to return to his room with nursing staff.  Objective:    Filed Vitals:   10/20/14 1422 10/20/14 2111 10/20/14 2155 10/21/14 0555  BP: 116/72 122/67 136/66 124/73  Pulse: 92 91 93 72  Temp: 97.6 F (36.4 C) 98.9 F (37.2 C) 98.6 F (37 C) 98.4 F (36.9 C)  TempSrc: Oral Oral Oral Oral  Resp: 18 18 20 20   Height:   6\' 1"  (1.854 m)   Weight:   86.4 kg (190 lb 7.6 oz)   SpO2: 100% 99%  100%    Intake/Output Summary (  Last 24 hours) at 10/21/14 1610 Last data filed at 10/21/14 0555  Gross per 24 hour  Intake    360 ml  Output    375 ml  Net    -15 ml    Exam: Gen/Psych:  Severely agitated, paranoid ideation. Cardiovascular:  RRR, No M/R/G Respiratory:  Lungs CTAB Gastrointestinal:  Abdomen soft, NT/ND, + BS Extremities:  No C/E/C   Data Reviewed:    Labs: Basic Metabolic Panel:  Recent Labs Lab  10/14/14 1255 10/15/14 0426 10/15/14 1140 10/16/14 0425  NA 137 134*  --  138  K 4.2 4.2  --  4.1  CL 103 101  --  106  CO2 26 23  --  24  GLUCOSE 290* 405*  --  261*  BUN 18 19  --  12  CREATININE 0.81 1.22 0.78 0.88  CALCIUM 10.1 10.0  --  9.7   GFR Estimated Creatinine Clearance: 90.8 mL/min (by C-G formula based on Cr of 0.88). Liver Function Tests:  Recent Labs Lab 10/14/14 1255 10/16/14 0425  AST 15 13*  ALT 16* 14*  ALKPHOS 88 75  BILITOT 0.7 0.8  PROT 7.0 6.2*  ALBUMIN 4.0 3.5   Coagulation profile  Recent Labs Lab 10/16/14 0425  INR 1.13    CBC:  Recent Labs Lab 10/14/14 1255 10/15/14 0426 10/15/14 1140 10/16/14 0425  WBC 4.7 5.1 6.4 5.6  NEUTROABS 3.2 3.9  --   --   HGB 14.1 14.0 13.1 13.2  HCT 42.6 41.6 40.2  39.9 39.7  MCV 93.4 91.8 92.2 93.4  PLT 264 260 238 241   CBG:  Recent Labs Lab 10/19/14 2124 10/20/14 0740 10/20/14 1142 10/20/14 2108 10/21/14 0758  GLUCAP 176* 294* 151* 360* 127*    Medications:   . amLODipine  5 mg Oral Daily  . atorvastatin  20 mg Oral q1800  . enoxaparin (LOVENOX) injection  40 mg Subcutaneous Q24H  . hydrocortisone cream   Topical TID  . hydrOXYzine  25 mg Oral BID  . insulin aspart  0-20 Units Subcutaneous TID WC  . insulin aspart  0-5 Units Subcutaneous QHS  . insulin aspart  6 Units Subcutaneous TID WC  . insulin glargine  25 Units Subcutaneous Daily  . oxyCODONE-acetaminophen  1 tablet Oral BID  . risperiDONE  1 mg Oral QHS   Continuous Infusions:    Time spent: 45 minutes with 30 minutes spent in crisis intervention.    LOS: 6 days   [facility] Pager [phone]. If unable to reach me by pager, please call my cell phone at [phone].  *Please refer to [email or web address], password [email or web address] to get updated schedule on who will round on this patient, as hospitalists switch teams weekly. If 7PM-7AM, please contact night-coverage at [email or web address], password [email or web address] for any overnight  needs.  10/21/2014, 8:21 AM

## 2014-10-21 NOTE — Progress Notes (Signed)
PT Cancellation Note  Patient Details Name: Ruben Williams MRN: [id number] DOB: March 12, 1946   Cancelled Treatment:    Reason Eval/Treat Not Completed: Observed pt to be restless/agitated, walking hallways with nursing attempting to get him back to room-pt not compliant. Pt then observed to slowly collapse and was assisted to floor by nursing. Will hold PT on today and check back another day once pt more appropriate.   Weston Anna, MPT Pager: [phone]

## 2014-10-22 DIAGNOSIS — R451 Restlessness and agitation: Secondary | ICD-10-CM

## 2014-10-22 DIAGNOSIS — F22 Delusional disorders: Secondary | ICD-10-CM

## 2014-10-22 LAB — GLUCOSE, CAPILLARY
GLUCOSE-CAPILLARY: 143 mg/dL — AB (ref 65–99)
Glucose-Capillary: 169 mg/dL — ABNORMAL HIGH (ref 65–99)
Glucose-Capillary: 316 mg/dL — ABNORMAL HIGH (ref 65–99)

## 2014-10-22 LAB — CREATININE, SERUM
Creatinine, Ser: 1.02 mg/dL (ref 0.61–1.24)
GFR calc Af Amer: >60 mL/min (ref >60)

## 2014-10-22 MED ORDER — HYDROXYZINE HCL 50 MG PO TABS
50.0000 mg | ORAL_TABLET | Freq: Two times a day (BID) | ORAL | Status: DC
  Administered 2014-10-22 – 2014-10-25 (×6): 50 mg via ORAL
  Filled 2014-10-22 (×6): qty 1
  Filled 2014-10-22: qty 2
  Filled 2014-10-22: qty 1
  Filled 2014-10-22: qty 2

## 2014-10-22 MED ORDER — LORAZEPAM 2 MG/ML IJ SOLN
1.0000 mg | Freq: Once | INTRAMUSCULAR | Status: AC
Start: 2014-10-22 — End: 2014-10-22
  Administered 2014-10-22: 1 mg via INTRAVENOUS
  Filled 2014-10-22: qty 1
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
## 2014-10-22 NOTE — Progress Notes (Signed)
Patient able to take 800 and 1000 medications at this time.  Patient more calm at this point, yet continues to have ideas of paranoia and fears harm.  With reassurance of safety and reason for admission, patient remains coopereative at this time. Will continue to monitor.

## 2014-10-22 NOTE — Progress Notes (Signed)
CSW assisting with d/c planning. Attempted to contact Ernest Pine ( APS ) 6/6 and 6/7 . Message left. Awaiting return call.  Werner Lean LCSW [phone]
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
## 2014-10-23 LAB — GLUCOSE, CAPILLARY
GLUCOSE-CAPILLARY: 349 mg/dL — AB (ref 65–99)
Glucose-Capillary: 160 mg/dL — ABNORMAL HIGH (ref 65–99)
Glucose-Capillary: 183 mg/dL — ABNORMAL HIGH (ref 65–99)
Glucose-Capillary: 100 mg/dL — ABNORMAL HIGH (ref 65–99)

## 2014-10-23 MED ORDER — AMLODIPINE BESYLATE 5 MG PO TABS: 5.0000 mg | ORAL_TABLET | Freq: Every day | ORAL | Status: AC

## 2014-10-23 MED ORDER — ATORVASTATIN CALCIUM 20 MG PO TABS: 20.0000 mg | ORAL_TABLET | Freq: Every day | ORAL | Status: AC

## 2014-10-23 MED ORDER — HYDROXYZINE HCL 50 MG PO TABS: 50.0000 mg | ORAL_TABLET | Freq: Two times a day (BID) | ORAL | Status: AC

## 2014-10-23 MED ORDER — HYDROCORTISONE 1 % EX CREA: TOPICAL_CREAM | Freq: Three times a day (TID) | CUTANEOUS | Status: AC

## 2014-10-23 MED ORDER — RISPERIDONE 1 MG PO TBDP: 1.0000 mg | ORAL_TABLET | Freq: Every day | ORAL | Status: AC

## 2014-10-23 MED ORDER — INSULIN GLARGINE 100 UNIT/ML ~~LOC~~ SOLN: 25.0000 [IU] | Freq: Every day | SUBCUTANEOUS | Status: DC

## 2014-10-23 MED ORDER — OXYCODONE-ACETAMINOPHEN 5-325 MG PO TABS: 1.0000 | ORAL_TABLET | Freq: Three times a day (TID) | ORAL | Status: AC | PRN

## 2014-10-23 MED ORDER — INSULIN ASPART PROT & ASPART (70-30 MIX) 100 UNIT/ML ~~LOC~~ SUSP: 7.0000 [IU] | Freq: Three times a day (TID) | SUBCUTANEOUS | Status: AC

## 2014-10-23 NOTE — Discharge Instructions (Signed)

## 2014-10-23 NOTE — Progress Notes (Signed)
OT Cancellation Note  Patient Details Name: Ruben Williams MRN: [id number] DOB: 03-21-1946   Cancelled Treatment:    Reason Eval/Treat Not Completed: Other (comment) Pt declines getting up with OT right now. He states several times, "I think I will just sit here for now." Encouraged him to get up with OT for grooming and asked if any toileting needs, but pt states he just went to the bathroom earlier and doesn't need to do any grooming right now. Sitter present and confirms he has been up to the bathroom earlier today. Pt supposed to d/c SNF today.  [place], [place] 10/23/2014, 10:29 AM
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
## 2014-10-24 LAB — GLUCOSE, CAPILLARY
GLUCOSE-CAPILLARY: 133 mg/dL — AB (ref 65–99)
GLUCOSE-CAPILLARY: 140 mg/dL — AB (ref 65–99)
Glucose-Capillary: 133 mg/dL — ABNORMAL HIGH (ref 65–99)
Glucose-Capillary: 191 mg/dL — ABNORMAL HIGH (ref 65–99)

## 2014-10-24 NOTE — Clinical Social Work Placement (Signed)
CSW provided bed offers to patient's brother, Ruben Williams who chose [facility]. Patient is set to discharge to [facility] once he has been without a sitter for 24 hours.     Ruben Williams, [facility] Clinical Social Worker (covering for Eduard Clos)    CLINICAL SOCIAL WORK PLACEMENT  NOTE  Date:  10/24/2014  Patient Details  Name: Ruben Williams MRN: [id number] Date of Birth: 1945/06/01  Clinical Social Work is seeking post-discharge placement for this patient at the   level of care (*CSW will initial, date and re-position this form in  chart as items are completed):      Patient/family provided with [facility]'s list of facilities offering this level of care within the geographic area requested by the patient (or if unable, by the patient's family).      Patient/family informed of their freedom to choose among providers that offer the needed level of care, that participate in Medicare, Medicaid or managed care program needed by the patient, have an available bed and are willing to accept the patient.      Patient/family informed of [facility]'s ownership interest in [facility] and [facility], as well as of the fact that they are under no obligation to receive care at these facilities.  PASRR submitted to EDS on       PASRR number received on       Existing PASRR number confirmed on       FL2 transmitted to all facilities in geographic area requested by pt/family on       FL2 transmitted to all facilities within larger geographic area on       Patient informed that his/her managed care company has contracts with or will negotiate with certain facilities, including the following:        Yes   Patient/family informed of bed offers received.  Patient chooses bed at [place]     Physician recommends and patient chooses bed at      Patient to  be transferred to [facility] on 10/24/14.  Patient to be transferred to facility by [facility]     Patient family notified on 10/24/14 of transfer.  Name of family member notified:  patient's brother, Ruben Williams via phone     PHYSICIAN       Additional Comment:    _______________________________________________ Standley Brooking, LCSW 10/24/2014, 2:31 PM

## 2014-10-24 NOTE — Consult Note (Signed)
[place] [facility]iat[facility] Consult   Reason for Consult:  Vascular dementia  Referring Physician:  Dr. Rockne Menghini Patient Identification: Ruben Williams MRN:  [id number] Principal Diagnosis: Dementia Diagnosis:   Patient Active Problem List   Diagnosis Date Noted  . Agitation [R45.1] 10/21/2014  . Paranoia (psychosis) [F22] 10/21/2014  . Dementia with behavioral disturbance [F03.91] 10/18/2014  . Hyperlipidemia LDL goal <70 [E78.5] 10/17/2014  . Hyperglycemia due to type 2 diabetes mellitus [E11.65]   . 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 Essential hypertension, benign [I10] 10/16/2014  . Dementia [F03.90] 10/16/2014  . 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 Dizziness [R42] 10/15/2014  . Weakness [R53.1] 10/15/2014    Total Time spent with patient: 20 minutes  Subjective:   Ruben Williams is a 69 y.o. male .  HPI: Ruben Williams is a 69 years old male seen for agitation and aggressive behaviors. Patient complained that staff jumped on him when he is trying get out of hospital and he does not remember why he came to the hospital. He was considered incompetent to make his own medical decisions and living arrangement. Patient repeatedly asking to remove his soft restraints and find a bed for him while he is staying in a  Hospital bed. Patient brother who came to visit and was at bed side. Staff RN stated that Dr. Rockne Menghini has plan to meet him and talk about his SNF placement needs. Patient brother stated that he needs to talk to his brother and his sister who lives in [place] before become a [place]. He seems to be supportive and understand the staff concerns.   Interval History: Patient seen today for psych follow up. Patient complained about feeling anxiety about leaving to a nursing facility. He appeared eating his supper without assistance. He has no irritability,  agitation or aggression. He is pleasantly confused and repeatedly asks me what I do, even after explained to him due to easily forgetful. Patient has no safety concerns and does not required a sitter.    Past Medical History:  Past Medical History  Diagnosis Date  . Hypertension   . Stroke   . Hyperlipidemia LDL goal <70 10/17/2014  . 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 Type 2 diabetes mellitus with neurological manifestations 10/16/2014  . Essential hypertension, benign 10/16/2014  . Dementia 10/16/2014    Past Surgical History  Procedure Laterality Date  . Knee arthroscopy    . Joint replacement      left knee  . Left and right knee arthroscopy    . Tonsillectomy    . Laser surgery to right eye     Family History:  Family History  Problem Relation Age of Onset  . Hypertension Father    Social History:  History  Alcohol Use No     History  Drug Use No    History   Social History  . Marital Status: Widowed    Spouse Name: N/A  . Number of Children: N/A  . Years of Education: N/A   Social History Main Topics  . Smoking status: Never Smoker   . Smokeless tobacco: Never Used  . Alcohol Use: No  . Drug Use: No  . Sexual Activity: Not Currently   Other Topics Concern  . None   Social History Narrative   Additional Social History:  Allergies:  No Known Allergies  Labs:  Results for orders placed or performed during the hospital encounter of 10/15/14 (from the past 48 hour(s))  Glucose, capillary     Status: Abnormal   Collection Time: 10/22/14  9:19 PM  Result Value Ref Range   Glucose-Capillary 316 (H) 65 - 99 mg/dL  Glucose, capillary     Status: Abnormal   Collection Time: 10/23/14  7:34 AM  Result Value Ref Range   Glucose-Capillary 160 (H) 65 - 99 mg/dL   Comment 1 Notify RN   Glucose, capillary     Status: Abnormal   Collection Time: 10/23/14 11:21 AM  Result Value Ref Range    Glucose-Capillary 349 (H) 65 - 99 mg/dL   Comment 1 Notify RN   Glucose, capillary     Status: Abnormal   Collection Time: 10/23/14  5:11 PM  Result Value Ref Range   Glucose-Capillary 183 (H) 65 - 99 mg/dL   Comment 1 Notify RN   Glucose, capillary     Status: Abnormal   Collection Time: 10/23/14  9:38 PM  Result Value Ref Range   Glucose-Capillary 100 (H) 65 - 99 mg/dL   Comment 1 Notify RN   Glucose, capillary     Status: Abnormal   Collection Time: 10/24/14  7:45 AM  Result Value Ref Range   Glucose-Capillary 133 (H) 65 - 99 mg/dL  Glucose, capillary     Status: Abnormal   Collection Time: 10/24/14 11:54 AM  Result Value Ref Range   Glucose-Capillary 191 (H) 65 - 99 mg/dL  Glucose, capillary     Status: Abnormal   Collection Time: 10/24/14  4:27 PM  Result Value Ref Range   Glucose-Capillary 133 (H) 65 - 99 mg/dL    Vitals: Blood pressure 139/82, pulse 99, temperature 98.7 F (37.1 C), temperature source Oral, resp. rate 18, height 6\' 1"  (1.854 m), weight 86.5 kg (190 lb 11.2 oz), SpO2 100 %.  Risk to Self: Is patient at risk for suicide?: No Risk to Others:   Prior Inpatient Therapy:   Prior Outpatient Therapy:    Current Facility-Administered Medications  Medication Dose Route Frequency Provider Last Rate Last Dose  . acetaminophen (TYLENOL) tablet 650 mg  650 mg Oral Q4H PRN Odette Horns, MD   325 mg at 10/20/14 2319  . amLODipine (NORVASC) tablet 5 mg  5 mg Oral Daily Venetia Maxon Rama, MD   5 mg at 10/24/14 1015  . atorvastatin (LIPITOR) tablet 20 mg  20 mg Oral q1800 Venetia Maxon Rama, MD   20 mg at 10/23/14 1815  . benztropine mesylate (COGENTIN) injection 1 mg  1 mg Intramuscular Once Venetia Maxon Rama, MD   Stopped at 10/21/14 1000  . enoxaparin (LOVENOX) injection 40 mg  40 mg Subcutaneous Q24H Florencia Reasons, MD   40 mg at 10/23/14 2235  . haloperidol lactate (HALDOL) injection 5 mg  5 mg Intravenous Q6H PRN Venetia Maxon Rama, MD   5 mg at 10/22/14 1104  .  hydrocortisone cream 1 %   Topical TID Venetia Maxon Rama, MD      . hydrOXYzine (ATARAX/VISTARIL) tablet 50 mg  50 mg Oral BID Ambrose Finland, MD   50 mg at 10/24/14 1015  . insulin aspart (novoLOG) injection 0-20 Units  0-20 Units Subcutaneous TID WC Venetia Maxon Rama, MD   4 Units at 10/24/14 1306  . insulin aspart (novoLOG) injection 0-5 Units  0-5 Units Subcutaneous QHS Venetia Maxon Rama, MD   4  Units at 10/22/14 2216  . insulin aspart (novoLOG) injection 7 Units  7 Units Subcutaneous TID WC Venetia Maxon Rama, MD   7 Units at 10/24/14 1308  . insulin glargine (LANTUS) injection 25 Units  25 Units Subcutaneous Daily Venetia Maxon Rama, MD   25 Units at 10/24/14 1017  . oxyCODONE-acetaminophen (PERCOCET/ROXICET) 5-325 MG per tablet 1 tablet  1 tablet Oral BID Venetia Maxon Rama, MD   1 tablet at 10/24/14 1016  . risperiDONE (RISPERDAL M-TABS) disintegrating tablet 1 mg  1 mg Oral QHS Venetia Maxon Rama, MD   1 mg at 10/23/14 2235    Musculoskeletal: Strength & Muscle Tone: decreased Gait & Station: unable to stand Patient leans: N/A  Psychiatric Specialty Exam: Physical Exam   ROS   Blood pressure 139/82, pulse 99, temperature 98.7 F (37.1 C), temperature source Oral, resp. rate 18, height 6\' 1"  (1.854 m), weight 86.5 kg (190 lb 11.2 oz), SpO2 100 %.Body mass index is 25.16 kg/(m^2).  General Appearance: Casual  Eye Contact::  Good  Speech:  Clear and Coherent  Volume:  Normal  Mood:  Anxious  Affect:  Appropriate  Thought Process:  Coherent, Goal Directed and Intact  Orientation:  Other:  for first and last name and DOB but not to person and places  Thought Content:  Rumination  Suicidal Thoughts:  No  Homicidal Thoughts:  No  Memory:  Immediate;   Poor Recent;   Poor  Judgement:  Impaired  Insight:  Lacking  Psychomotor Activity:  Decreased  Concentration:  Fair  Recall:  [employer] of Knowledge:Poor  Language: Fair  Akathisia:  Negative  Handed:  Right  AIMS (if  indicated):     Assets:  Desire for Improvement Financial Resources/Insurance Housing Leisure Time Resilience  ADL's:  Impaired  Cognition: Impaired,  Mild  Sleep:      Medical Decision Making: New problem, with additional work up planned, Review of Psycho-Social Stressors (1), Review or order clinical lab tests (1), Established Problem, Worsening (2), Review or order medicine tests (1), Review of Medication Regimen & Side Effects (2) and Review of New Medication or Change in Dosage (2)  Treatment Plan Summary: Patient has cognitive deficits, history of CVA, hyperglycemia, generalized weakness. Patient was not able to manage his own medical care. Patient seems like suffering with vascular dementia. Daily contact with patient to assess and evaluate symptoms and progress in treatment and Medication management  Plan:  Patient is psychiatrically cleared for out of home placement and may be discharged tomorrow to [facility] No safety concerns and no sitter required since this morning Patient has no capacity to make his own medical decisions and living arrangements  Continue Risperidone 1 mg PO Qhs for agitation and add Hydroxyzine 50 mg QHS for anxiety  Patient does not meet criteria for psychiatric inpatient admission. Supportive therapy provided about ongoing stressors.  Appreciate psychiatric consultation and will sign off at this time Please contact [phone] or [phone] if needs further assistance  Disposition: Patient benefit from out-of-home placement may been skilled nursing facility or assisted living facility as he cannot care for himself and needs psychosocial support.  Desirre Eickhoff,JANARDHAHA R. 10/24/2014 5:50 PM

## 2014-10-24 NOTE — Progress Notes (Signed)
PT seen and examined at bedside, stable for d/c. Please see discharge note 10/23/2014, no changes needed. Faye Ramsay, MD  [facility] Pager [phone]  If 7PM-7AM, please contact night-coverage [email or web address] Password [email or web address]

## 2014-10-25 LAB — GLUCOSE, CAPILLARY
GLUCOSE-CAPILLARY: 197 mg/dL — AB (ref 65–99)
Glucose-Capillary: 233 mg/dL — ABNORMAL HIGH (ref 65–99)
Glucose-Capillary: 117 mg/dL — ABNORMAL HIGH (ref 65–99)

## 2014-10-25 NOTE — Progress Notes (Signed)
EMS called to transport pt to [facility].  [phone].

## 2014-10-25 NOTE — Progress Notes (Signed)
Physical Therapy Treatment Patient Details Name: Ruben Williams MRN: [id number] DOB: 1946/04/20 Today's Date: 10/25/2014    History of Present Illness Ruben Williams is an 69 y.o. male with a past medical history significant for HTN, DM, stroke that reportedly affected is left side, dementia, and recurrent falls, blindness right eye, brought in with complains of dizziness, falls, unsteadiness.  CT + pituitary mass    PT Comments    Initially pt declined to get OOB c/o about his "old water" and how "they keep telling me not to get up".  Got pt a new pitcher of ICE water then pt's friend in room convinced pt to walk.  Pt was able to self rise OOB to amb to BR.  Tolerated amb a great distance in hallway using Rollator walker.  No LOB.  Occasional lateral sway.  Good alternating gait.  Mild shuffle.    Follow Up Recommendations  SNF [facility])     Equipment Recommendations  None recommended by PT    Recommendations for Other Services       Precautions / Restrictions Precautions Precautions: Fall Precaution Comments: blindness  R eye, Hx dementia Restrictions Weight Bearing Restrictions: No    Mobility  Bed Mobility Overal bed mobility: Modified Independent                Transfers Overall transfer level: Modified independent Equipment used: None Transfers: Sit to/from [facility] Sit to Stand: Modified independent (Device/Increase time) Stand pivot transfers: Modified independent (Device/Increase time)       General transfer comment: close range for safety   Ambulation/Gait Ambulation/Gait assistance: Supervision Ambulation Distance (Feet): 800 Feet Assistive device: Rolling walker (2 wheeled);None Gait Pattern/deviations: Step-through pattern;Decreased stride length;Shuffle;Trunk flexed Gait velocity: decreased   General Gait Details: amb with RW pt did better but required Min VC's on safety and proper walker to self distance esp around turns.   Amb with out walker in room pt tended to steady self using bed/doorway/wall.    Stairs            Wheelchair Mobility    Modified Rankin (Stroke Patients Only)       Balance                                    Cognition Arousal/Alertness: Awake/alert Behavior During Therapy: Restless;Impulsive Overall Cognitive Status: Impaired/Different from baseline (per friend)           Safety/Judgement: Decreased awareness of safety          Exercises      General Comments        Pertinent Vitals/Pain Pain Assessment: No/denies pain    Home Living                      Prior Function            PT Goals (current goals can now be found in the care plan section) Progress towards PT goals: Progressing toward goals    Frequency  Min 3X/week    PT Plan      Co-evaluation             End of Session   Activity Tolerance: Patient tolerated treatment well Patient left: in chair;with call bell/phone within reach;with chair alarm set;with family/visitor present     Time: 1015-1100 PT Time Calculation (min) (ACUTE ONLY): 45 min  Charges:  $Gait Training:  23-37 mins $Therapeutic Activity: 8-22 mins                    G Codes:      Rica Koyanagi  PTA [facility]  Acute  Rehab Pager      [phone]

## 2014-10-25 NOTE — Progress Notes (Signed)
[facility] has accepted pt for admission today. Pt's brother contacted and message left for him to contact CSW. Pt is unable to sign himself in at [facility]. Will request pt's brother Dredyn Gubbels [phone] / [phone] ) to sign admission papers. Pt can be trans to [facility] once papers are signed.  Werner Lean LCSW [phone]

## 2014-10-25 NOTE — Progress Notes (Signed)
Pt being discharged to [facility].  Report given to [place], [place].  Pt going to room 104.

## 2014-10-25 NOTE — Progress Notes (Signed)
[facility] is willing to accept pt today and have pt's brother sign admission papers on Monday. Nsg will continue trying to reach pt's brother to inform him of pt's disposition. Nsg will contact [facility] for transport once scripts are available.   Werner Lean LCSW [phone]

## 2014-10-25 NOTE — Progress Notes (Signed)
CSW assisting with d/c planning. Ruben Williams is unable to assist with placement. [facility] will screen pt this afternoon and report back to CSW with a decision.  Werner Lean LCSW [phone]
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
## 2014-10-25 NOTE — Progress Notes (Signed)
CSW has just rec'd word from [facility] that they cannot accept patient. CSW has contacted [facility] who is considering patient. Brother on standby to assist with SNF paperwork.      Eduard Clos, MSW, [place]
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
## 2014-10-25 NOTE — Progress Notes (Signed)
Pt discharged.  EMS arrived and transporting pt to [facility].  Room air.  Denies pain.  No complaints.  Left two messages for pt brother to call me to report transfer.  No call received from brother at this time.  Pt leaving with personal belongings.

## 2014-10-25 NOTE — Progress Notes (Signed)
Again, left a message for pt brother, Uriyah Raska, to call regarding his brother's transfer.  No answer.  Messages left at [phone] and [phone].

## 2014-10-25 NOTE — Progress Notes (Signed)
Tried to contact pt brother, Nazir Hacker, to report pt transfer to [facility].  No answer.  Left message for Brenn Deziel to call me.  Message left at [phone] and [phone]

## 2014-10-26 NOTE — Progress Notes (Signed)
Pt sister, Law Corsino, called.  Sister lives in [place].  Reports pt brother, Doniel Maiello, at [facility].  I reported to [place] that pt discharged yesterday at approximately 1830 to [facility], [address], [place], [place].  She states she will tell [employer].  She reports Timmey Lamba will be back on Monday, 6/13.  I reported to her that Ithiel Liebler needs to call [facility] on Monday and sign admission papers.  Cathie Olden reports she will give the information to [facility], pt brother.  Merdis Delay thanked me for the information and again reports she will tell [employer].

## 2014-10-27 ENCOUNTER — Emergency Department (HOSPITAL_COMMUNITY)
Admission: EM | Admit: 2014-10-27 | Discharge: 2014-10-27 | Disposition: A | Discharge status: Skilled Nursing Facility | Payer: Medicare Other | Attending: Emergency Medicine | Admitting: Emergency Medicine

## 2014-10-27 ENCOUNTER — Encounter (HOSPITAL_COMMUNITY): Payer: Self-pay | Admitting: Emergency Medicine

## 2014-10-27 DIAGNOSIS — Z794 Long term (current) use of insulin: Secondary | ICD-10-CM | POA: Diagnosis not present

## 2014-10-27 DIAGNOSIS — Z8673 Personal history of transient ischemic attack (TIA), and cerebral infarction without residual deficits: Secondary | ICD-10-CM | POA: Diagnosis not present

## 2014-10-27 DIAGNOSIS — E1140 Type 2 diabetes mellitus with diabetic neuropathy, unspecified: Secondary | ICD-10-CM | POA: Insufficient documentation

## 2014-10-27 DIAGNOSIS — E785 Hyperlipidemia, unspecified: Secondary | ICD-10-CM | POA: Diagnosis not present

## 2014-10-27 DIAGNOSIS — I10 Essential (primary) hypertension: Secondary | ICD-10-CM | POA: Diagnosis not present

## 2014-10-27 DIAGNOSIS — Z86018 Personal history of other benign neoplasm: Secondary | ICD-10-CM | POA: Insufficient documentation

## 2014-10-27 DIAGNOSIS — Z79899 Other long term (current) drug therapy: Secondary | ICD-10-CM | POA: Insufficient documentation

## 2014-10-27 DIAGNOSIS — F0391 Unspecified dementia with behavioral disturbance: Secondary | ICD-10-CM | POA: Diagnosis not present

## 2014-10-27 DIAGNOSIS — R4182 Altered mental status, unspecified: Secondary | ICD-10-CM | POA: Diagnosis present

## 2014-10-27 DIAGNOSIS — Z7982 Long term (current) use of aspirin: Secondary | ICD-10-CM | POA: Insufficient documentation

## 2014-10-27 LAB — URINALYSIS, ROUTINE W REFLEX MICROSCOPIC
BILIRUBIN URINE: NEGATIVE
Glucose, UA: NEGATIVE mg/dL
HGB URINE DIPSTICK: NEGATIVE
Ketones, ur: NEGATIVE mg/dL
Leukocytes, UA: NEGATIVE
Nitrite: NEGATIVE
PH: 5 (ref 5.0–8.0)
PROTEIN: NEGATIVE mg/dL
Specific Gravity, Urine: 1.02 (ref 1.005–1.030)
Urobilinogen, UA: 0.2 mg/dL (ref 0.0–1.0)

## 2014-10-27 LAB — I-STAT CHEM 8, ED
BUN: 24 mg/dL — ABNORMAL HIGH (ref 6–20)
CREATININE: 1.2 mg/dL (ref 0.61–1.24)
Calcium, Ion: 1.41 mmol/L — ABNORMAL HIGH (ref 1.13–1.30)
Chloride: 105 mmol/L (ref 101–111)
GLUCOSE: 131 mg/dL — AB (ref 65–99)
HEMATOCRIT: 40 % (ref 39.0–52.0)
Hemoglobin: 13.6 g/dL (ref 13.0–17.0)
Potassium: 4 mmol/L (ref 3.5–5.1)
Sodium: 139 mmol/L (ref 135–145)
TCO2: 21 mmol/L (ref 0–100)

## 2014-10-27 NOTE — ED Notes (Signed)
PTAR has been called  

## 2014-10-27 NOTE — Discharge Instructions (Signed)

## 2014-10-27 NOTE — ED Notes (Addendum)
Attempted to call [facility].
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
## 2014-10-27 NOTE — ED Notes (Signed)
Awaiting PTAR. 

## 2014-10-27 NOTE — ED Notes (Signed)
Pt aware of the need for urine sample, urinal provided.

## 2014-10-27 NOTE — ED Notes (Addendum)
Pt from [facility] via EMS. Today was his first day at this nursing home. He left nursing home one time and tried to go to his family home. He wanted to leave. He became combative with nursing home staff. When EMS arrived he stated that he had to go to the hospital "for brain surgery". Pt has a history of dementia. Pt was cooperative with EMS. Pt was able to follow commands and is alert and oriented per baseline. Pt had complaints of a headache but had no recent falls.

## 2014-10-27 NOTE — ED Notes (Addendum)
Spoke to Lelan Pons at [facility] about patient coming back to facility.
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
## 2014-10-27 NOTE — Progress Notes (Signed)
Pt brother, Finnian Husted, called today.  Reported to him that his brother was at [facility], [address] [place], [place].  Reported to him that he needs to call on Monday and sign admission papers for his brother.  Ray Gravette reports understanding.

## 2014-10-27 NOTE — ED Notes (Signed)
PTAR at bedside 

## 2014-11-07 ENCOUNTER — Encounter (HOSPITAL_COMMUNITY): Payer: Self-pay | Admitting: Emergency Medicine

## 2014-11-07 ENCOUNTER — Emergency Department (HOSPITAL_COMMUNITY): Payer: Medicare Other

## 2014-11-07 ENCOUNTER — Emergency Department (HOSPITAL_COMMUNITY)
Admission: EM | Admit: 2014-11-07 | Discharge: 2014-11-08 | Disposition: A | Discharge status: Home / Self Care | Payer: Medicare Other | Attending: Emergency Medicine | Admitting: Emergency Medicine

## 2014-11-07 DIAGNOSIS — E785 Hyperlipidemia, unspecified: Secondary | ICD-10-CM | POA: Insufficient documentation

## 2014-11-07 DIAGNOSIS — Z7952 Long term (current) use of systemic steroids: Secondary | ICD-10-CM | POA: Insufficient documentation

## 2014-11-07 DIAGNOSIS — E1149 Type 2 diabetes mellitus with other diabetic neurological complication: Secondary | ICD-10-CM | POA: Insufficient documentation

## 2014-11-07 DIAGNOSIS — Z8673 Personal history of transient ischemic attack (TIA), and cerebral infarction without residual deficits: Secondary | ICD-10-CM | POA: Insufficient documentation

## 2014-11-07 DIAGNOSIS — F0391 Unspecified dementia with behavioral disturbance: Secondary | ICD-10-CM | POA: Insufficient documentation

## 2014-11-07 DIAGNOSIS — R443 Hallucinations, unspecified: Secondary | ICD-10-CM | POA: Diagnosis present

## 2014-11-07 DIAGNOSIS — Z794 Long term (current) use of insulin: Secondary | ICD-10-CM | POA: Diagnosis not present

## 2014-11-07 DIAGNOSIS — I10 Essential (primary) hypertension: Secondary | ICD-10-CM | POA: Insufficient documentation

## 2014-11-07 DIAGNOSIS — F03918 Unspecified dementia, unspecified severity, with other behavioral disturbance: Secondary | ICD-10-CM

## 2014-11-07 DIAGNOSIS — Z7982 Long term (current) use of aspirin: Secondary | ICD-10-CM | POA: Insufficient documentation

## 2014-11-07 DIAGNOSIS — Z79899 Other long term (current) drug therapy: Secondary | ICD-10-CM | POA: Diagnosis not present

## 2014-11-07 DIAGNOSIS — Z86018 Personal history of other benign neoplasm: Secondary | ICD-10-CM | POA: Diagnosis not present

## 2014-11-07 LAB — URINALYSIS, ROUTINE W REFLEX MICROSCOPIC
Bilirubin Urine: NEGATIVE
GLUCOSE, UA: 500 mg/dL — AB
HGB URINE DIPSTICK: NEGATIVE
Ketones, ur: NEGATIVE mg/dL
LEUKOCYTES UA: NEGATIVE
Nitrite: NEGATIVE
PH: 6 (ref 5.0–8.0)
Protein, ur: NEGATIVE mg/dL
Specific Gravity, Urine: 1.027 (ref 1.005–1.030)
Urobilinogen, UA: 1 mg/dL (ref 0.0–1.0)

## 2014-11-07 LAB — BASIC METABOLIC PANEL
Anion gap: 8 (ref 5–15)
BUN: 13 mg/dL (ref 6–20)
CHLORIDE: 106 mmol/L (ref 101–111)
CO2: 25 mmol/L (ref 22–32)
Calcium: 10 mg/dL (ref 8.9–10.3)
Creatinine, Ser: 0.98 mg/dL (ref 0.61–1.24)
GFR calc Af Amer: >60 mL/min (ref >60)
GFR calc non Af Amer: >60 mL/min (ref >60)
GLUCOSE: 261 mg/dL — AB (ref 65–99)
POTASSIUM: 4.1 mmol/L (ref 3.5–5.1)
SODIUM: 139 mmol/L (ref 135–145)

## 2014-11-07 LAB — CBC WITH DIFFERENTIAL/PLATELET
BASOS PCT: 1 % (ref 0–1)
Basophils Absolute: 0 10*3/uL (ref 0.0–0.1)
EOS ABS: 0.1 10*3/uL (ref 0.0–0.7)
Eosinophils Relative: 1 % (ref 0–5)
HEMATOCRIT: 36.2 % — AB (ref 39.0–52.0)
HEMOGLOBIN: 12.2 g/dL — AB (ref 13.0–17.0)
Lymphocytes Relative: 22 % (ref 12–46)
Lymphs Abs: 1.2 10*3/uL (ref 0.7–4.0)
MCH: 30.8 pg (ref 26.0–34.0)
MCHC: 33.7 g/dL (ref 30.0–36.0)
MCV: 91.4 fL (ref 78.0–100.0)
MONO ABS: 0.3 10*3/uL (ref 0.1–1.0)
MONOS PCT: 6 % (ref 3–12)
Neutro Abs: 3.7 10*3/uL (ref 1.7–7.7)
Neutrophils Relative %: 70 % (ref 43–77)
Platelets: 262 10*3/uL (ref 150–400)
RBC: 3.96 MIL/uL — ABNORMAL LOW (ref 4.22–5.81)
RDW: 12.6 % (ref 11.5–15.5)
WBC: 5.3 10*3/uL (ref 4.0–10.5)

## 2014-11-07 LAB — CBG MONITORING, ED
GLUCOSE-CAPILLARY: 357 mg/dL — AB (ref 65–99)
Glucose-Capillary: 160 mg/dL — ABNORMAL HIGH (ref 65–99)

## 2014-11-07 MED ORDER — INSULIN ASPART 100 UNIT/ML ~~LOC~~ SOLN
0.0000 [IU] | Freq: Three times a day (TID) | SUBCUTANEOUS | Status: DC
  Administered 2014-11-08: 8 [IU] via SUBCUTANEOUS

## 2014-11-07 MED ORDER — INSULIN ASPART 100 UNIT/ML ~~LOC~~ SOLN
0.0000 [IU] | Freq: Every day | SUBCUTANEOUS | Status: DC
  Administered 2014-11-07: 5 [IU] via SUBCUTANEOUS
  Filled 2014-11-07: qty 1

## 2014-11-07 NOTE — ED Provider Notes (Signed)
CSN: [id number]     Arrival date & time 11/07/14  1118 History   First MD Initiated Contact with Patient 11/07/14 1159     Chief Complaint  Patient presents with  . Hallucinations     (Consider location/radiation/quality/duration/timing/severity/associated sxs/prior Treatment) HPI 69 y.o. male with ho DM, HTN, prior CVA, dementia presents from home after he called police numerous times throughout the night suspecting that someone was watching him. He denies pain, fever, other symptoms. He arrives in company of his family, specifically his daughter from [place], who states that the patient no longer has anyone willing to care for him and his house has fallen into disrepair.  Level 5 caveat for dementia Past Medical History  Diagnosis Date  . Hypertension   . Stroke   . Hyperlipidemia LDL goal <70 10/17/2014  . 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 Type 2 diabetes mellitus with neurological manifestations 10/16/2014  . Essential hypertension, benign 10/16/2014  . Dementia 10/16/2014   Past Surgical History  Procedure Laterality Date  . Knee arthroscopy    . Joint replacement      left knee  . Left and right knee arthroscopy    . Tonsillectomy    . Laser surgery to right eye     Family History  Problem Relation Age of Onset  . Hypertension Father    History  Substance Use Topics  . Smoking status: Never Smoker   . Smokeless tobacco: Never Used  . Alcohol Use: No    Review of Systems  Unable to perform ROS: Dementia      Allergies  Review of patient's allergies indicates no known allergies.  Home Medications   Prior to Admission medications   Medication Sig Start Date End Date Taking? Authorizing Provider  amLODipine (NORVASC) 5 MG tablet Take 1 tablet (5 mg total) by mouth daily. 10/23/14  Yes Theodis Blaze, MD  aspirin EC 81 MG tablet Take 162 mg by mouth daily.   Yes Historical Provider, MD  atorvastatin (LIPITOR) 20 MG tablet Take 1 tablet (20  mg total) by mouth daily at 6 PM. Patient taking differently: Take 20 mg by mouth daily.  10/23/14  Yes Theodis Blaze, MD  hydrocortisone cream 1 % Apply topically 3 (three) times daily. 10/23/14  Yes Theodis Blaze, MD  hydrOXYzine (ATARAX/VISTARIL) 50 MG tablet Take 1 tablet (50 mg total) by mouth 2 (two) times daily. 10/23/14  Yes Theodis Blaze, MD  insulin aspart protamine- aspart (NOVOLOG MIX 70/30) (70-30) 100 UNIT/ML injection Inject 0.07 mLs (7 Units total) into the skin 3 (three) times daily. Patient taking differently: Inject 7 Units into the skin 3 (three) times daily before meals.  10/23/14  Yes Theodis Blaze, MD  LANTUS SOLOSTAR 100 UNIT/ML Solostar Pen Inject 25 Units into the skin at bedtime. 11/02/14  Yes Historical Provider, MD  oxyCODONE-acetaminophen (PERCOCET/ROXICET) 5-325 MG per tablet Take 1 tablet by mouth every 8 (eight) hours as needed for severe pain. 10/23/14  Yes Theodis Blaze, MD  risperiDONE (RISPERDAL) 1 MG tablet Take 1 mg by mouth at bedtime.   Yes Historical Provider, MD  divalproex (DEPAKOTE) 125 MG DR tablet Take 125 mg by mouth 2 (two) times daily. 11/06/14   Historical Provider, MD  risperiDONE (RISPERDAL M-TABS) 1 MG disintegrating tablet Take 1 tablet (1 mg total) by mouth at bedtime. Patient not taking: Reported on 10/27/2014 10/23/14   Theodis Blaze, MD   BP 134/78 mmHg  Pulse 74  Temp(Src) 98.4 F (36.9 C) (Oral)  Resp 16  SpO2 100% Physical Exam  Constitutional: He appears well-developed and well-nourished.  HENT:  Head: Normocephalic and atraumatic.  Eyes: Conjunctivae and EOM are normal.  Neck: Normal range of motion. Neck supple.  Cardiovascular: Normal rate, regular rhythm and normal heart sounds.   Pulmonary/Chest: Effort normal and breath sounds normal. No respiratory distress.  Abdominal: He exhibits no distension. There is no tenderness. There is no rebound and no guarding.  Musculoskeletal: Normal range of motion.  Neurological: He is alert. He has  normal strength. No cranial nerve deficit or sensory deficit. GCS eye subscore is 4. GCS verbal subscore is 4. GCS motor subscore is 6.  Oriented to name alone  Skin: Skin is warm and dry.  Vitals reviewed.   ED Course  Procedures (including critical care time) Labs Review Labs Reviewed  CBC WITH DIFFERENTIAL/PLATELET - Abnormal; Notable for the following:    RBC 3.96 (*)    Hemoglobin 12.2 (*)    HCT 36.2 (*)    All other components within normal limits  BASIC METABOLIC PANEL - Abnormal; Notable for the following:    Glucose, Bld 261 (*)    All other components within normal limits  URINALYSIS, ROUTINE W REFLEX MICROSCOPIC (NOT AT [facility]) - Abnormal; Notable for the following:    Glucose, UA 500 (*)    All other components within normal limits    Imaging Review Dg Chest 2 View  11/07/2014   CLINICAL DATA:  Hallucinations, history hypertension, stroke, hyperlipidemia, type II diabetes mellitus  EXAM: CHEST  2 VIEW  COMPARISON:  10/14/2014  FINDINGS: Normal heart size, mediastinal contours, and pulmonary vascularity.  Question vague nodular density versus nipple shadow RIGHT base.  Lungs otherwise clear.  No pleural effusion or pneumothorax.  Mild endplate spur formation thoracic spine.  IMPRESSION: Questionable RIGHT nipple shadow; repeat PA chest radiograph with nipple markers recommended to exclude pulmonary nodule.   Electronically Signed   By: Lavonia Dana M.D.   On: 11/07/2014 14:53   Ct Head Wo Contrast  11/07/2014   CLINICAL DATA:  Hallucinations, altered mental status, hypertension, type II diabetes mellitus, hyperlipidemia, benign essential hypertension, pituitary macro adenoma with extra sellar extension  EXAM: CT HEAD WITHOUT CONTRAST  TECHNIQUE: Contiguous axial images were obtained from the base of the skull through the vertex without intravenous contrast.  COMPARISON:  10/14/2014 ; correlation MR brain 10/15/2014  FINDINGS: Generalized atrophy.  Normal ventricular morphology.   No midline shift or mass effect.  No intracranial hemorrhage or evidence acute infarction.  Known pituitary mass is much better visualized on preceding MR.  Dense calcifications in anterior falx.  No other intracranial mass lesions.  Bones and sinuses unremakable.  Atherosclerotic calcifications at the carotid siphons.  IMPRESSION: Generalized atrophy.  Known pituitary macroadenoma, better visualized on prior MR.  No definite acute intracranial abnormalities.   Electronically Signed   By: Lavonia Dana M.D.   On: 11/07/2014 14:28     EKG Interpretation   Date/Time:  Thursday November 07 2014 14:24:45 EDT Ventricular Rate:  72 PR Interval:  166 QRS Duration: 84 QT Interval:  386 QTC Calculation: 422 R Axis:   -7 Text Interpretation:  Normal sinus rhythm Possible Anterior infarct , age  undetermined Abnormal ECG No significant change since last tracing  Confirmed by Debby Freiberg [phone]) on 11/07/2014 3:16:43 PM      MDM   Final diagnoses:  None    69 y.o. male with pertinent  PMH of dementia, prior CVA, DM presents with paranoid delusions. Called police numerous times from 2 am for someone trying to steal his things, unsubstantiated.  Chronic symptoms x 1 year, waxing and waning.  He has been seen numerous times for similar symptoms, including by myself.  Whereas on his last visit the patient was oriented x 3, he is now oriented to name and place, but does not know the year and is difficult to obtain a history from.  Will obtain metabolic wu, however I suspect that this is likely a waxing and waning process related to dementia.    Social work and case management involved, psych consulted for geri-psych placement with plan to place in geriatric psych if possible or home with family if not.  I have reviewed all laboratory and imaging studies if ordered as above  1. Paranoid delusion      Debby Freiberg, MD 11/07/14 1640

## 2014-11-07 NOTE — ED Notes (Signed)
Sister at bedside , reports that pt has been calling 911 all night reporting intruders and possible kidnapping. Pt shouted out loud toward the sister and asked her to leave. This RN attempted to calm pt when he stated that the sister "wants to  take my money, I don't need her here"

## 2014-11-07 NOTE — BH Assessment (Signed)
[place] Assessment Progress Note  Pt referred to: [facility] (no beds available today, but accepted referral for future consideration)  No beds available:  [place], [place] Triage Specialist [phone]

## 2014-11-07 NOTE — ED Notes (Signed)
Bed: WA27 Expected date:  Expected time:  Means of arrival:  Comments: 

## 2014-11-07 NOTE — Progress Notes (Signed)
CSW faxed patient's IVC documentation to [facility].  CSW reached out to [place] at [facility] who verifies that the pt has been accepted. She states that they will have a bed for the patient in the morning. Also,she states nurse report can be done in the morning.  Accepting Physician: Si Gaul Report Number: [id number]  Willette Brace [phone] ED CSW 11/07/2014 11:41 PM

## 2014-11-07 NOTE — ED Notes (Signed)
Patient is in restroom, will do EKG after

## 2014-11-07 NOTE — Progress Notes (Addendum)
Pt referred to: Ruben Williams  Ruben Williams [facility]   No beds available:  Sherlie Ban  [facility]- left message no answer  Exclusionary Criteria of Dementia: [place], [place] Work  [employer] [phone]

## 2014-11-07 NOTE — ED Notes (Signed)
MD notified of CBG >300.

## 2014-11-07 NOTE — ED Notes (Signed)
Pt from home, per ems pt reports hallucinations, pt believe that people were breaking into his house and tried to kidnap him. Hx dementia.

## 2014-11-07 NOTE — ED Notes (Signed)
Bed: [facility] Expected date:  Expected time:  Means of arrival:  Comments: psych

## 2014-11-07 NOTE — Progress Notes (Addendum)
ED RN consulted ED CM about pt's sister's concern about his living condition Referred to ED SW. CM reviewed EPIC notes Pt recently d/c to [facility] on 10/25/14 Social work referral entered in [place] calls had been made to get brother Ray to sign snf admission forms  11/07/14 Pt in [facility] ED, came from home, per ems pt reports hallucinations, pt believe that people were breaking into his house and tried to kidnap him. Hx dementia.  75 Sister from [place] and another male at pt's bedside.  One male left to go back to lobby.  Sister states she recently arrived to visit pt Report erratic behavior, attempting to drive when he can not, uses a cane, aggressive to family, paranoid.  Pt d/c from snf per sister on 10/30/14 to home after he left snf x 2 prior to his d/c Reports the first time he left snf he "went home" and the second time "he was found down the road" from snf  Pt displaying paranoid s/s during EDP assessment asking who staff members are and stating he has never seen them before Reports he is being "watched" Pt not stating he called the GDP but sister states [employer] called to pt home because when she arrived to see pt there were "five police cars at his home" Sister states there was a male at his home but she is not sure if and what home health agency male is from  Sister left after her phone continuously ringing as EDP and CM attempted to speak with her about pt She states she can not take care of pt and he can not be at his home alone.  Sister left to return to lobby she said to check on family  1241 ED SW updated

## 2014-11-07 NOTE — Progress Notes (Addendum)
CSW met with pt sister who shared that patient has been home alone, and has refused to let anyone stay in the home, has not been taking medications, threatening to shoot people, found guns in the home, and has been calling police since 2 am stating that people are breaking into the house that are not there. Pt has told family members that specific people who are deceased are watching the patient and are spies.   CSW spoke with EDP, who recommends TTS consult. CSW completed TTS consult, staffed with NP who recommends geripsych.   Belia Heman, LCSW  Clinical Social Work  [employer] [phone]    Addendum: CSW spoke with pt sister regarding disposition. Per discussion with supervisor, if geri psych bed not available and stable, patient will need to return home with family and for family to make arrangements for patient. Pt sister aware, and agrees that patient will need 24 hour supervision and family will to ensure pt safety and well being.

## 2014-11-07 NOTE — Progress Notes (Signed)
Shirlee Limerick of [facility] reached out to CSW and stated that she would need lab results and EKG for pt.   CSW faxed Shirlee Limerick the requested clinical information.  Willette Brace [phone] ED CSW 11/07/2014 4:52 PM

## 2014-11-07 NOTE — BH Assessment (Addendum)
Assessment Note  Ruben Williams is an 69 y.o. male who presents to the ED with sister with increased agitation, confusion, hallucinations, and threatening behaviors. CSW met with pt at bed side, who is only alert to self. Patient states he came to the hosptial because his blood is low. Patient sister came to speak with CSW, and patient became very agitated. Pt stated, "She's dead to me, she hasn't doesn't anythying for me in years." Patient sister shared that she came down to visit after patient was discharged home from the skilled nursing facility. Pt had been eloping from the skilled nursing facility (not a locked unit) and returning home. Pt has not been taking medications and is not able to provide for himself in terms of food and medication management. Pt reportedly has been calling police since 2 am stating that people are coming to the house breaking in and stealing $20,000. Per pt sister, pt has been fixated on multiple people stealing large amounts of money, gifting money, and then threatening to shoot people. Per sister, pt states, "I'm going to shoot anyone unwelcome who walks in the door in the face." Yesterday patient found 3 pistols and stated, "I'm so glad I found my guns, I don't even care about the money, now I can't shoot whoever bothers me and tries to steal from me." Pt sister stated that she and the family members plan to find guns and remove from the home. Pt appears anxious and agitated, and then appears calm and cooperative. Pt sister reports pt has been seeing people who are deceased claiming they are spies and watching him.   Axis I: Dementia with behavioral disturbance  Past Medical History  Diagnosis Date  . Hypertension   . Stroke   . Hyperlipidemia LDL goal <70 10/17/2014  . 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 Type 2 diabetes mellitus with neurological manifestations 10/16/2014  . Essential hypertension, benign 10/16/2014  . Dementia 10/16/2014    Past  Medical History:  Past Medical History  Diagnosis Date  . Hypertension   . Stroke   . Hyperlipidemia LDL goal <70 10/17/2014  . 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 Type 2 diabetes mellitus with neurological manifestations 10/16/2014  . Essential hypertension, benign 10/16/2014  . Dementia 10/16/2014    Past Surgical History  Procedure Laterality Date  . Knee arthroscopy    . Joint replacement      left knee  . Left and right knee arthroscopy    . Tonsillectomy    . Laser surgery to right eye      Family History:  Family History  Problem Relation Age of Onset  . Hypertension Father     Social History:  reports that he has never smoked. He has never used smokeless tobacco. He reports that he does not drink alcohol or use illicit drugs.  Additional Social History:     CIWA: CIWA-Ar BP: 139/82 mmHg Pulse Rate: 85 COWS:    Allergies: No Known Allergies  Home Medications:  (Not in a hospital admission)  OB/GYN Status:  No LMP for male patient.  General Assessment Data Location of Assessment: [facility] ED TTS Assessment: In system Is this a Tele or Face-to-Face Assessment?: Face-to-Face Is this an Initial Assessment or a Re-assessment for this encounter?: Initial Assessment Marital status: Single Living Arrangements: Alone Can pt return to current living arrangement?: No Admission Status: Voluntary Is patient capable of signing voluntary admission?: No Referral Source: Self/Family/Friend Insurance type: Medicare  Crisis Care Plan Living Arrangements: Alone Name of Psychiatrist: none Name of Therapist: none  Education Status Is patient currently in school?: No  Risk to self with the past 6 months Suicidal Ideation: No Has patient been a risk to self within the past 6 months prior to admission? : No Suicidal Intent: No Has patient had any suicidal intent within the past 6 months prior to admission? : No Is patient at risk for suicide?:  No Suicidal Plan?: No Has patient had any suicidal plan within the past 6 months prior to admission? : No Access to Means: Yes Specify Access to Suicidal Means: guns in house What has been your use of drugs/alcohol within the last 12 months?: no Previous Attempts/Gestures: No How many times?: 0 Other Self Harm Risks: no Triggers for Past Attempts: None known Intentional Self Injurious Behavior: None Family Suicide History: Unable to assess Recent stressful life event(s): Conflict (Comment), Loss (Comment) (recent diagnosisi of dementia, loss of indepdence ) Persecutory voices/beliefs?: Yes Depression: No  Risk to Others within the past 6 months Homicidal Ideation: Yes-Currently Present Thoughts of Harm to Others: Yes-Currently Present Comment - Thoughts of Harm to Others: anyone who  walks into the house unwelcome, those that are  Current Homicidal Intent: Yes-Currently Present Current Homicidal Plan: Yes-Currently Present Describe Current Homicidal Plan: shoot someone  Access to Homicidal Means: Yes Describe Access to Homicidal Means: 3 guns in the home per sisiter who saw Identified Victim: anyone unwelcome in the house History of harm to others?: Yes Assessment of Violence: In past 6-12 months Violent Behavior Description: has been combative at times when disoriented, calm during admission Does patient have access to weapons?: Yes (Comment) Criminal Charges Pending?: No Does patient have a court date: No Is patient on probation?: No  Psychosis Hallucinations: Auditory, Visual Delusions: Grandiose  Mental Status Report Appearance/Hygiene: Disheveled Eye Contact: Fair Motor Activity: Unremarkable Speech: Argumentative, Pressured Level of Consciousness: Alert Mood: Anxious, Other (Comment) (calm and cooperative) Affect: Angry, Anxious, Appropriate to circumstance, Fearful Anxiety Level: Minimal Thought Processes: Coherent, Relevant Judgement: Unimpaired Orientation:  Person, Place Obsessive Compulsive Thoughts/Behaviors: None  Cognitive Functioning Concentration: Normal Memory: Recent Impaired, Remote Impaired IQ: Average Insight: Fair Impulse Control: Fair Appetite: Fair Sleep: Decreased Vegetative Symptoms: None  ADLScreening [facility]) Patient's cognitive ability adequate to safely complete daily activities?: Yes Patient able to express need for assistance with ADLs?: Yes Independently performs ADLs?: Yes (appropriate for developmental age)  Prior Inpatient Therapy Prior Inpatient Therapy: No  Prior Outpatient Therapy Prior Outpatient Therapy: No Does patient have an ACCT team?: No Does patient have Intensive In-House Services?  : No Does patient have [facility] services? : No Does patient have P4CC services?: No  ADL Screening (condition at time of admission) Patient's cognitive ability adequate to safely complete daily activities?: Yes Patient able to express need for assistance with ADLs?: Yes Independently performs ADLs?: Yes (appropriate for developmental age)                  Additional Information 1:1 In Past 12 Months?: No CIRT Risk: Yes Elopement Risk: Yes Does patient have medical clearance?: Yes     Disposition:  Disposition Initial Assessment Completed for this Encounter: Yes Disposition of Patient: Inpatient treatment program Type of inpatient treatment program: Adult  On Site Evaluation by:   Reviewed with Physician:  Reginold Agent NP, who recommends geri psych.   Dane Bloch A 11/07/2014 1:32 PM

## 2014-11-07 NOTE — ED Notes (Addendum)
Pt wanded and 1 pt belonging bag placed in locker#27. Pt had cash and it was counted in front of the pt and locked with security. Pt had 22-$100 billls, 11-$50, and 29-$1 bills.

## 2014-11-08 LAB — CBG MONITORING, ED: Glucose-Capillary: 264 mg/dL — ABNORMAL HIGH (ref 65–99)

## 2014-11-08 NOTE — ED Notes (Signed)
Pt discharged for admission to [facility] Pys[facility] unit with [facility] Department. Pt checked and collected his belongings and ambulated out of department without incident.

## 2014-11-08 NOTE — Progress Notes (Signed)
pcp is Immunologist administration [address], [place], [place] [address]

## 2014-11-08 NOTE — Discharge Instructions (Signed)

## 2014-11-08 NOTE — BH Assessment (Signed)
[place] Assessment Progress Note   Clinician left message for Sheriff's dept regarding need for IVC transport to [place] this AM.

## 2014-11-08 NOTE — Progress Notes (Signed)
CSW confirmed with Shirlee Limerick at [facility], pt bed is ready. Pt dc pending transport by sheirff's department. TTS counselor left message for sheriff for transport this morning. Awaiting call back regarding time.   Ruben Williams, [place] Work  [employer] [phone]

## 2014-11-14 NOTE — ED Notes (Signed)
At the time of discharged, pt had money locked up with security.  The security guard, Sharlet Salina, got the money from the security box and counted the money with the patient and the IT consultant present.  The money amount was agreed upon by Sharlet Salina, the patient, and the IT consultant before the money was placed back into the envelop and given to the IT consultant.  The sheriff escorted the patient out of the department to [facility] since the pt was IVC and admitted to [facility] for continued treatment.

## 2014-12-04 ENCOUNTER — Encounter (HOSPITAL_COMMUNITY): Payer: Self-pay | Admitting: Emergency Medicine

## 2014-12-04 ENCOUNTER — Emergency Department (HOSPITAL_COMMUNITY)
Admission: EM | Admit: 2014-12-04 | Discharge: 2014-12-04 | Disposition: A | Discharge status: Home / Self Care | Payer: Medicare Other | Attending: Emergency Medicine | Admitting: Emergency Medicine

## 2014-12-04 DIAGNOSIS — Z7982 Long term (current) use of aspirin: Secondary | ICD-10-CM | POA: Diagnosis not present

## 2014-12-04 DIAGNOSIS — E1149 Type 2 diabetes mellitus with other diabetic neurological complication: Secondary | ICD-10-CM

## 2014-12-04 DIAGNOSIS — F0391 Unspecified dementia with behavioral disturbance: Secondary | ICD-10-CM

## 2014-12-04 DIAGNOSIS — F0281 Dementia in other diseases classified elsewhere with behavioral disturbance: Secondary | ICD-10-CM | POA: Diagnosis not present

## 2014-12-04 DIAGNOSIS — F419 Anxiety disorder, unspecified: Secondary | ICD-10-CM | POA: Diagnosis not present

## 2014-12-04 DIAGNOSIS — F03918 Unspecified dementia, unspecified severity, with other behavioral disturbance: Secondary | ICD-10-CM

## 2014-12-04 DIAGNOSIS — E1165 Type 2 diabetes mellitus with hyperglycemia: Secondary | ICD-10-CM | POA: Insufficient documentation

## 2014-12-04 DIAGNOSIS — Z79899 Other long term (current) drug therapy: Secondary | ICD-10-CM | POA: Diagnosis not present

## 2014-12-04 DIAGNOSIS — Z8673 Personal history of transient ischemic attack (TIA), and cerebral infarction without residual deficits: Secondary | ICD-10-CM | POA: Insufficient documentation

## 2014-12-04 DIAGNOSIS — Z86018 Personal history of other benign neoplasm: Secondary | ICD-10-CM | POA: Diagnosis not present

## 2014-12-04 DIAGNOSIS — E785 Hyperlipidemia, unspecified: Secondary | ICD-10-CM | POA: Insufficient documentation

## 2014-12-04 DIAGNOSIS — F22 Delusional disorders: Secondary | ICD-10-CM

## 2014-12-04 DIAGNOSIS — I10 Essential (primary) hypertension: Secondary | ICD-10-CM | POA: Diagnosis not present

## 2014-12-04 DIAGNOSIS — Z794 Long term (current) use of insulin: Secondary | ICD-10-CM | POA: Insufficient documentation

## 2014-12-04 DIAGNOSIS — Z7952 Long term (current) use of systemic steroids: Secondary | ICD-10-CM | POA: Insufficient documentation

## 2014-12-04 DIAGNOSIS — R739 Hyperglycemia, unspecified: Secondary | ICD-10-CM

## 2014-12-04 LAB — URINALYSIS, ROUTINE W REFLEX MICROSCOPIC
Bilirubin Urine: NEGATIVE
Hgb urine dipstick: NEGATIVE
Ketones, ur: NEGATIVE mg/dL
LEUKOCYTES UA: NEGATIVE
Nitrite: NEGATIVE
Protein, ur: NEGATIVE mg/dL
Specific Gravity, Urine: 1.028 (ref 1.005–1.030)
Urobilinogen, UA: 0.2 mg/dL (ref 0.0–1.0)
pH: 6 (ref 5.0–8.0)

## 2014-12-04 LAB — BASIC METABOLIC PANEL
Anion gap: 7 (ref 5–15)
BUN: 13 mg/dL (ref 6–20)
CALCIUM: 9.7 mg/dL (ref 8.9–10.3)
CHLORIDE: 107 mmol/L (ref 101–111)
CO2: 25 mmol/L (ref 22–32)
CREATININE: 1 mg/dL (ref 0.61–1.24)
Glucose, Bld: 325 mg/dL — ABNORMAL HIGH (ref 65–99)
POTASSIUM: 3.9 mmol/L (ref 3.5–5.1)
Sodium: 139 mmol/L (ref 135–145)

## 2014-12-04 LAB — CBC
HCT: 35.4 % — ABNORMAL LOW (ref 39.0–52.0)
HEMOGLOBIN: 11.9 g/dL — AB (ref 13.0–17.0)
MCH: 30.4 pg (ref 26.0–34.0)
MCHC: 33.6 g/dL (ref 30.0–36.0)
MCV: 90.5 fL (ref 78.0–100.0)
PLATELETS: 282 10*3/uL (ref 150–400)
RBC: 3.91 MIL/uL — AB (ref 4.22–5.81)
RDW: 13 % (ref 11.5–15.5)
WBC: 5.9 10*3/uL (ref 4.0–10.5)

## 2014-12-04 LAB — URINE MICROSCOPIC-ADD ON

## 2014-12-04 LAB — CBG MONITORING, ED: Glucose-Capillary: 301 mg/dL — ABNORMAL HIGH (ref 65–99)

## 2014-12-04 NOTE — ED Notes (Signed)
Maudie Mercury, Case manager, at bedside

## 2014-12-04 NOTE — Progress Notes (Signed)
CSW spoke with patient at bedside. There was no family present.  Per note, patient presents to [facility] due to hyperglycemia. Patient informed CSW that he stays at home alone in [place]. Nurse CM informed CSW that the pt stated that he has been sleeping there at home.  Patient states that his phone line is disconnected.   Patient states that he does not fall often. He informed CSW that he completes his ADL's independently. Patient states that he does not feel as though he has a good support system. Patient states that he feels his siblings steal from him while he is at home.  Asencion Partridge of DSS was suppose to meet with the pt today. However, she called and stated that she would not be coming to meet the pt in [place]. She states that she needed to meet him at [facility] and speak with him and the [place] social worker to make sure he had services. Asencion Partridge spoke with the pt over the speaker phone and informed him that the meeting would be at 11:00pm tomorrow.   CSW reached out to [place] worker Venancio Poisson Mock to inform her that Asencion Partridge did not meet with the pt. Today in [place]. However, she did not answer the phone.  [facility] -[place]/ [phone] [facility] Social Worker [place] Mock [phone]  Willette Brace [phone] ED CSW 12/04/2014 4:03 PM

## 2014-12-04 NOTE — Care Management Note (Addendum)
Case Management Note  Patient Details  Name: HUSEIN GUEDES MRN: [id number] Date of Birth: 1945-10-15  Subjective/Objective:         69 yr old medicare pt last seen, evaluated and transferred to "[place]" snf on 11/08/14.  After lots of redirecting and assessment pt informed cm he was d/c from [place] to home and offered services but pt does know name of company even after CM reviewed Her list of [place] home health services Pt did respond to [place] but can not recall if he has been seen recently Pt recalls a "Otila Kluver" came to see him "nice lady" Pt states he had "papers when I left [place] but they took them and tore them up before I could see them."  Pt states he lives alone and his sister [place]") and brother Lysle Rubens") are not supportive Had 2 sons with one recently passed away and one "I don't trust"  Reports having a niece with 2 "kids and a boyfriend that drinks" and he does not want their support This indicates pt does not have a reliable primary care giver nor does he want one Pt has various canes at home only and reports no issue with walking Reports he "drove" to ED  But aware "I ain't suppose to be driving"  Throughout the assessment pt noted to have word finding issues and memory issues. He reports coming to ED because he had "chest pain" and a scar on his left forearm.  Pt states his family is "out to get my money" and belongings Pt recalls having assist from [place]     "[place] made me one hundred percent"        Action/Plan:  Assessed pt for home health needs.   CM redirected pt as needed.  CM asked pt frequently "who is they?" see below notes CM reviewed in details medicare guidelines, home health [facility]) (length of stay in home, types of [facility] staff available, coverage, primary caregiver, up to 24 hrs before services may be started) and Private duty nursing (PDN-coverage, length of stay in the home types of staff available).  CM provided pt with a list  of [place] home health agencies and PDN.  CM observe pt walk to restroom without difficulty nor DME to provide urine specimen  Expected Discharge Date:   12/04/14                Expected Discharge Plan:    Home with home health with assist of APS & VA  In-House Referral:   EDSW  Discharge [place] with home health with assist of APS & VA  Post Acute Care Choice:   Home with home health with assist of APS & VA Choice offered to:   pt  DME Arranged:   na  DME Agency:   na  HH Arranged:   [place]:   [facility]  Status of Service:   completed  Additional Comments:  [place] from [place] snf returned a call to state pt was not set up with home health prior to leaving snf and [place] Mock informed the snf staff she would be arranging home health. ED Cm thanked her for her return call  [place] from [place] called to inquire if pt had another number to be reached at  [place] recalled ED CM to state she would not be coming to visit the pt in the ED and requested ED CM inform the  pt to meet her at [facility] at 0900 on 12/05/14., CM inquired of APS staff Asencion Partridge if she was sure that she wanted this pt (pmh Dementia) to meet her on 12/05/14 and reminded her had been asked not to drive at one time [place] informed CM pt can and does drive.  CM informed Asencion Partridge pt stated he slept in his home last night  1410 CM spoke with Asencion Partridge who called CM to say she was coming to visit pt in [facility] ED She also requested Cm inquire "where pt slept last night" CM asked pt who states he "slept at home" When Cm asked when he had last been to [place] pt states "two weeks ago"    1334 response from [place] that [facility] will accept the referral and are able to do 24 hr nursing start of care visits as well Pt updated [place] referral to Leeroy Cha [phone] with Manuela Schwartz number for pre authorization  1324 EDP updated Orders requested for [facility] with face to face plus pending visit from  [place] staff 1314 Manuela Schwartz [phone] ext [place]  called CM to assist with pt home health services, working with [place] [facility] Request cm tell [facility] to call her for pre authorization Verified address in EPIC with her and contact numbers liste [phone] CM spoke with [facility] covering ED SW to update her and received a return call from [place] [phone] to state Asencion Partridge was left a voice message about pt in [facility] ED, ? Guarantee time of ED d/c, and left her ED CM contact. Mechele Claude also states she left the [place] staff ED CM's contact information CM agree [place] updated on [place] CM  1251 Cm spoke with [place] [phone] option 0 and asked for transfer.  Mechele Claude states APS trying to track him down and unable to reach him related not being at the home and no phone or disconnect- trying to set up skilled care for pt Reports [facility] unable to provide services for pt related last time they initiated an APS report for pt  1242 call [place] snf ([phone] and spoke with Ailene Ravel, [place] who states she will return a call to [facility] but generally they use [facility] for   1241 left a message for Dani Gobble Sw at [place] [phone] inquired about home health services and other resources for pt   Robbie Lis, RN 12/04/2014, 12:16 PM

## 2014-12-04 NOTE — Discharge Instructions (Signed)

## 2014-12-04 NOTE — ED Provider Notes (Signed)
CSN: [id number]     Arrival date & time 12/04/14  5397 History   First MD Initiated Contact with Patient 12/04/14 1031     Chief Complaint  Patient presents with  . Hyperglycemia     (Consider location/radiation/quality/duration/timing/severity/associated sxs/prior Treatment) HPI Comments: Pt wants help with his medications, is unsure how to use his insulin, Is also concerned we will think he's crazy and "lock him up". Denies SI/HI, AVH. No physical complaints.   Patient is a 69 y.o. male presenting with hyperglycemia. The history is provided by the patient. The history is limited by the condition of the patient and the absence of a caregiver.  Hyperglycemia Blood sugar level PTA:  300 Severity:  Unable to specify Onset quality:  Unable to specify Timing:  Unable to specify Progression:  Unable to specify Diabetes status:  Controlled with insulin Time since last antidiabetic medication: unknown. Context comment:  Dementia with paranoia Relieved by:  Nothing Ineffective treatments:  None tried Associated symptoms: no abdominal pain, no chest pain, no confusion, no dizziness, no dysuria, no fatigue, no fever, no increased thirst, no nausea, no polyuria, no shortness of breath, no vomiting and no weakness     Past Medical History  Diagnosis Date  . Hypertension   . Stroke   . Hyperlipidemia LDL goal <70 10/17/2014  . 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 Type 2 diabetes mellitus with neurological manifestations 10/16/2014  . Essential hypertension, benign 10/16/2014  . Dementia 10/16/2014   Past Surgical History  Procedure Laterality Date  . Knee arthroscopy    . Joint replacement      left knee  . Left and right knee arthroscopy    . Tonsillectomy    . Laser surgery to right eye     Family History  Problem Relation Age of Onset  . Hypertension Father    History  Substance Use Topics  . Smoking status: Never Smoker   . Smokeless tobacco: Never Used   . Alcohol Use: No    Review of Systems  Constitutional: Negative for fever, activity change, appetite change and fatigue.  HENT: Negative for congestion, facial swelling, rhinorrhea and trouble swallowing.   Eyes: Negative for photophobia and pain.  Respiratory: Negative for cough, chest tightness and shortness of breath.   Cardiovascular: Negative for chest pain and leg swelling.  Gastrointestinal: Negative for nausea, vomiting, abdominal pain, diarrhea and constipation.  Endocrine: Negative for polydipsia and polyuria.  Genitourinary: Negative for dysuria, urgency, decreased urine volume and difficulty urinating.  Musculoskeletal: Negative for back pain and gait problem.  Skin: Negative for color change, rash and wound.  Allergic/Immunologic: Negative for immunocompromised state.  Neurological: Negative for dizziness, facial asymmetry, speech difficulty, weakness, numbness and headaches.  Psychiatric/Behavioral: Negative for confusion, decreased concentration and agitation. The patient is nervous/anxious.       Allergies  Review of patient's allergies indicates no known allergies.  Home Medications   Prior to Admission medications   Medication Sig Start Date End Date Taking? Authorizing Provider  amLODipine (NORVASC) 5 MG tablet Take 1 tablet (5 mg total) by mouth daily. 10/23/14  Yes Theodis Blaze, MD  aspirin EC 81 MG tablet Take 162 mg by mouth daily.   Yes Historical Provider, MD  atorvastatin (LIPITOR) 20 MG tablet Take 1 tablet (20 mg total) by mouth daily at 6 PM. Patient taking differently: Take 20 mg by mouth daily.  10/23/14  Yes Theodis Blaze, MD  hydrocortisone cream 1 % Apply topically  3 (three) times daily. 10/23/14  Yes Theodis Blaze, MD  hydrOXYzine (ATARAX/VISTARIL) 50 MG tablet Take 1 tablet (50 mg total) by mouth 2 (two) times daily. 10/23/14  Yes Theodis Blaze, MD  insulin aspart protamine- aspart (NOVOLOG MIX 70/30) (70-30) 100 UNIT/ML injection Inject 0.07 mLs (7  Units total) into the skin 3 (three) times daily. Patient taking differently: Inject 7 Units into the skin 3 (three) times daily before meals.  10/23/14  Yes Theodis Blaze, MD  LANTUS SOLOSTAR 100 UNIT/ML Solostar Pen Inject 25 Units into the skin at bedtime. 11/02/14  Yes Historical Provider, MD  oxyCODONE-acetaminophen (PERCOCET/ROXICET) 5-325 MG per tablet Take 1 tablet by mouth every 8 (eight) hours as needed for severe pain. 10/23/14  Yes Theodis Blaze, MD  risperiDONE (RISPERDAL M-TABS) 1 MG disintegrating tablet Take 1 tablet (1 mg total) by mouth at bedtime. 10/23/14  Yes Theodis Blaze, MD  divalproex (DEPAKOTE) 125 MG DR tablet Take 125 mg by mouth 2 (two) times daily. 11/06/14   Historical Provider, MD  mirtazapine (REMERON) 30 MG tablet Take 30 mg by mouth at bedtime. 11/22/14 12/22/14  Historical Provider, MD  risperiDONE (RISPERDAL) 2 MG tablet Take 2 mg by mouth 2 (two) times daily. 11/22/14 12/22/14  Historical Provider, MD  traZODone (DESYREL) 50 MG tablet Take 50 mg by mouth at bedtime as needed. 11/22/14 12/22/14  Historical Provider, MD  Vitamin D, Ergocalciferol, (DRISDOL) 50000 UNITS CAPS capsule Take 50,000 Units by mouth once a week. 11/22/14   Historical Provider, MD   BP 161/75 mmHg  Pulse 76  Temp(Src) 98.4 F (36.9 C) (Oral)  Resp 17  SpO2 100% Physical Exam  Constitutional: He is oriented to person, place, and time. He appears well-developed and well-nourished. No distress.  HENT:  Head: Normocephalic and atraumatic.  Mouth/Throat: No oropharyngeal exudate.  Eyes: Pupils are equal, round, and reactive to light.  Neck: Normal range of motion. Neck supple.  Cardiovascular: Normal rate, regular rhythm and normal heart sounds.  Exam reveals no gallop and no friction rub.   No murmur heard. Pulmonary/Chest: Effort normal and breath sounds normal. No respiratory distress. He has no wheezes. He has no rales.  Abdominal: Soft. Bowel sounds are normal. He exhibits no distension and no mass.  There is no tenderness. There is no rebound and no guarding.  Musculoskeletal: Normal range of motion. He exhibits no edema or tenderness.  Neurological: He is alert and oriented to person, place, and time.  Skin: Skin is warm and dry.  Psychiatric: He has a normal mood and affect. His speech is tangential. He is agitated. Thought content is paranoid.    ED Course  Procedures (including critical care time) Labs Review Labs Reviewed  BASIC METABOLIC PANEL - Abnormal; Notable for the following:    Glucose, Bld 325 (*)    All other components within normal limits  CBC - Abnormal; Notable for the following:    RBC 3.91 (*)    Hemoglobin 11.9 (*)    HCT 35.4 (*)    All other components within normal limits  URINALYSIS, ROUTINE W REFLEX MICROSCOPIC (NOT AT [facility]) - Abnormal; Notable for the following:    Glucose, UA >1000 (*)    All other components within normal limits  CBG MONITORING, ED - Abnormal; Notable for the following:    Glucose-Capillary 301 (*)    All other components within normal limits  URINE MICROSCOPIC-ADD ON    Imaging Review No results found.  EKG Interpretation None      MDM   Final diagnoses:  Hyperglycemia  Dementia with behavioral disturbance  Paranoia (psychosis)    Pt is a 69 y.o. male with Pmhx as above who presents with request for someone to help him with his high blood sugar.  Patient states that he was here recently and that he "got locked up".  I believe he means he was here recently and was evaluated by psychiatry.  His history of hallucinations and paranoid delusions.  Seems to want help at home managing his medical problems.  He denies SI or HI.  His history is somewhat tangential, though he does not appear overtly psychotic.  His glucose is 325.  His anion gap 7.  I think what he truly is.  His home health services.  He has mentioned a woman named Chad, through the [place] who believes is a Education officer, museum.  We'll speak to our case managers in the  ED.   Case management has seen the patient is also spoke, to Chad, who is patient's Education officer, museum through the [place]. Marland Kitchen  She states that home health services had been ordered prior to patient's recent [place] psych admission in [place], however, there were concerns for an unsafe home environment in adult protective services report have been filed.  They had not been able to speak to the patient as he was either not home or did not have a phone.  She has requested that [facility].protective services see the patient in the ED, she should be on her way to finish their evaluation.    PAS called back, Asencion Partridge has decided she will meet with pt tomorrow at office in [place]. [facility] home health services have been set up. Pt does not meet criteria for inpt treatment. WIll d/c home.       Ernestina Patches, MD 12/05/14 [phone]

## 2014-12-04 NOTE — ED Notes (Signed)
Pt got mad when i was asking him questions regarding where he lives and if he lives alone or with others.   Pt wasn't able to tell me what year it was. When I asked pt to tell me who the president of [place] was, he stated "I don't know his name, but some black guy.  I thought you weren't going to ask me anymore questions!"  Pt stated that he was at church on Sunday, and one of the leaders at church is who checked pt's blood sugar.

## 2014-12-04 NOTE — ED Notes (Signed)
Case Management came out of room stating that they were done with everything they needed to do with patient.

## 2014-12-04 NOTE — ED Notes (Addendum)
Pt came in for CBG test, CBG is 301 in triage. Pt states that he has difficulty using his insulin because it is difficult to see the "little needle" and put the medicine into the syringe. Pt states he does not have a glucometer at home, states that he bought "a whole lot of stuff, but I don't know what its all for! I might have one of those but I don't even know." Pt appears to require large amount of diabetic education in order to be compliant with diabetic treatment. Pt adds that 2 days ago he fell and hit his head and arm, now has lower abdominal pain and left arm abrasions. Pt had fall evaluated.

## 2014-12-05 NOTE — Progress Notes (Addendum)
1441 Cm received a call from [facility] at 1426 stating pt did finally make it (drove)  to the [place] and has been seen by her and [place].  Reports pt cbg elevated and is being transferred to [facility].  A goal was discussed for ALF for pt safety.  CM asked Mechele Claude if she could update Manuela Schwartz. This Cm updated Cleora Fleet at [phone]. Home health services on hold for pt but pt will be followed for needs.  Discussed available local ALF like [facility] or a residential home locally that also may benefit pt.  Discussed pt fair support systems, possible future guardianship meeting for pt per interest of his sister 49 Cm spoke with Santiago Glad at [place] to check if pt made his appt and attempt to get a better number for pt Santiago Glad states pt had not checked in to [place] for 12/05/14 and she and Cm verified same home and work numbers in each data base finding no other alternative numbers for pt  116 CM left a message for [place] [phone] to let her know Cm has not been able to find another number for pt but will call if one found. Cm left her mobile number for a return call as needed 1110 Received a message from [employer] that the pt [facility] RN converted to [place] RN services Informed Tim CM attempting to find an active number for pt  [place] brother contacted at [phone] who confirms he has not seen pt today and "don't know if he has a cell phone or not"  1058 CM dialed pt's listed home number [phone]    This number is disconnect and Work: [phone] is for [employer]  1056 ED CM called [phone] to attempt to speak with Raelyn Number SW of [place] to check on pt fu appt with APS staff CM left CM return mobile number for her

## 2015-08-16 DEATH — deceased

## 2016-09-05 IMAGING — CT CT HEAD W/O CM
2 series · 17 of 30 positions shown, 20 images · non-contrast
Comparison: CT head 12/04/2012

CLINICAL DATA: Altered mental status.  Hyperglycemia

EXAM:
CT HEAD WITHOUT CONTRAST
TECHNIQUE: Contiguous axial images were obtained from the base of the skull
through the vertex without intravenous contrast.

[Series 2: head w/o · axial · non-contrast · 0.50mm/px · z∈[+1297,+1417]mm · 9 of 32 slices shown, 12 images]
[im 4/32  brain]
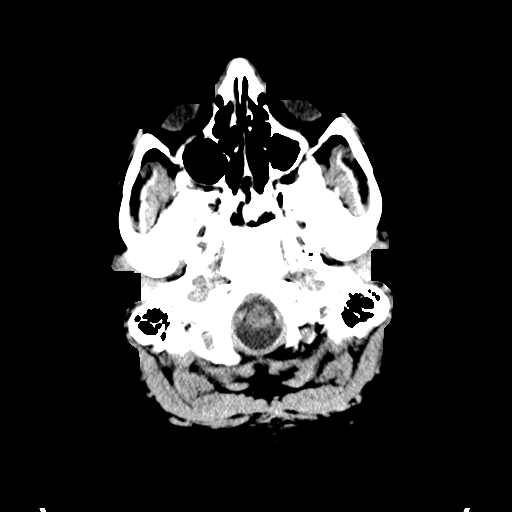
[im 4/32  bone]
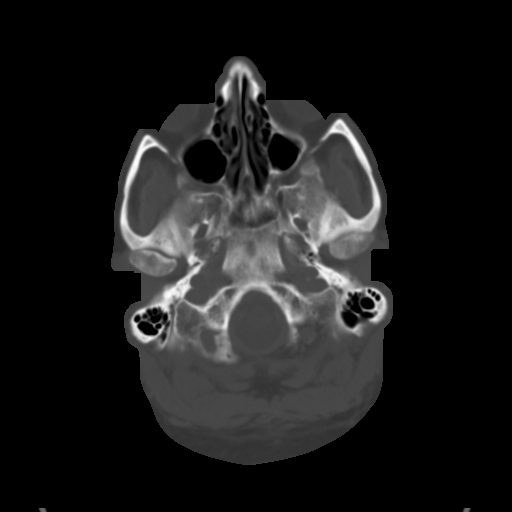
[im 7/32  brain]
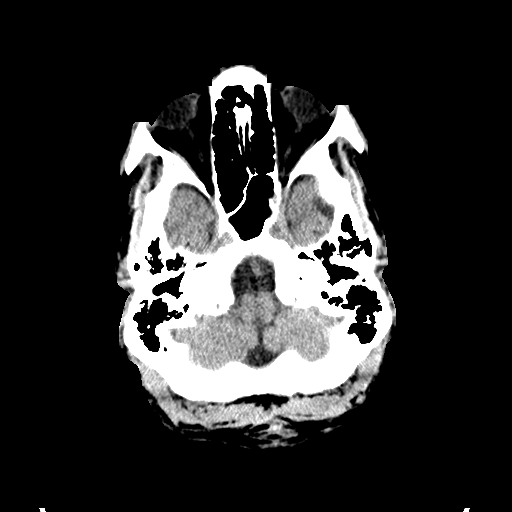
[im 10/32  brain]
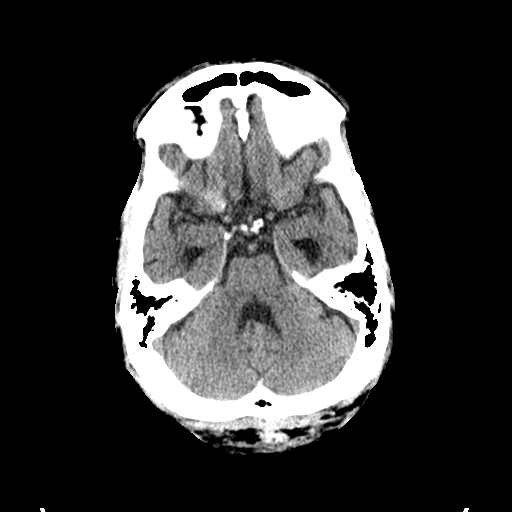
[im 13/32  brain]
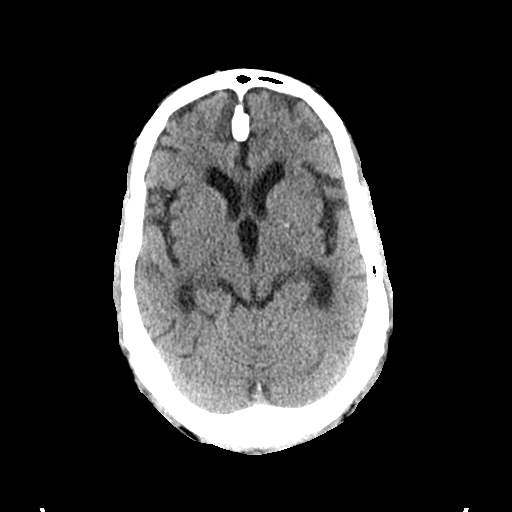
[im 16/32  brain]
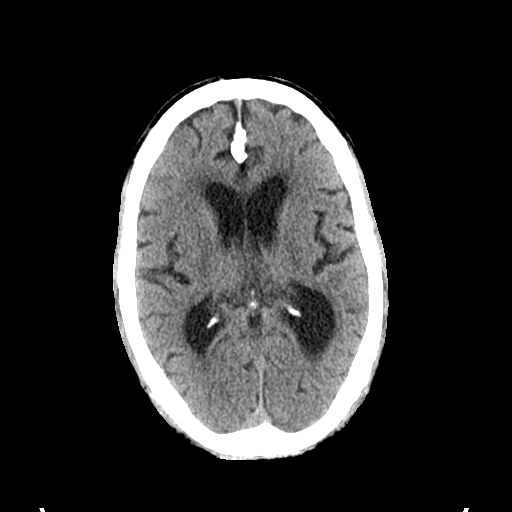
[im 16/32  bone]
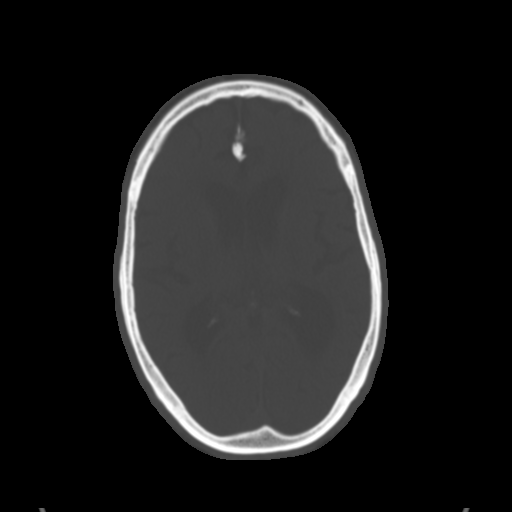
[im 19/32  brain]
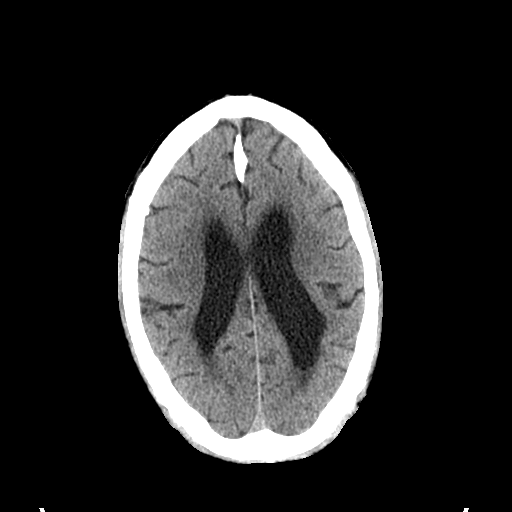
[im 22/32  brain]
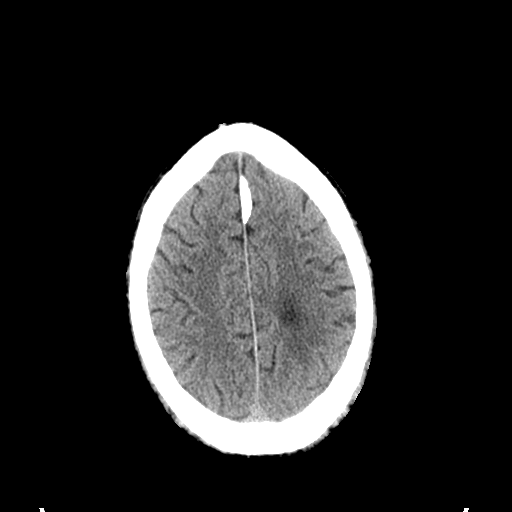
[im 25/32  brain]
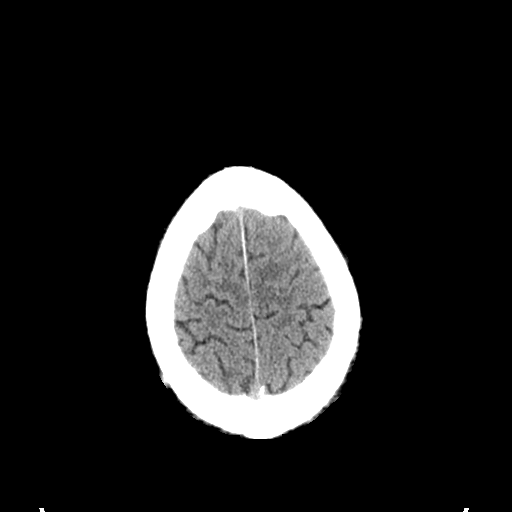
[im 28/32  brain]
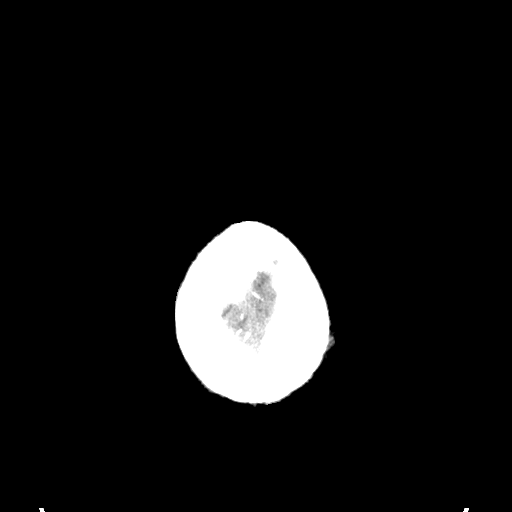
[im 28/32  bone]
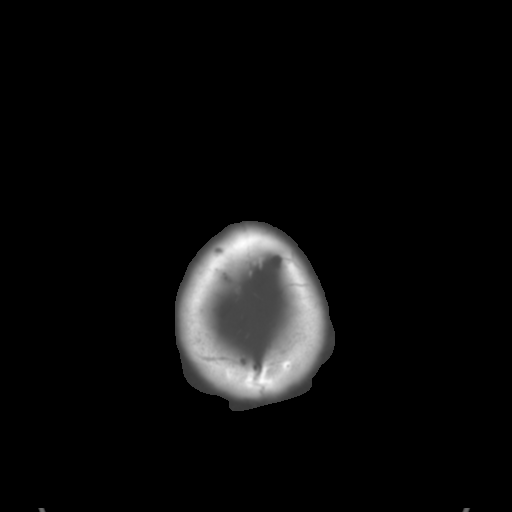

[Series 3: bone windows · axial · 0.50mm/px · z∈[+1297,+1417]mm · 8 of 52 slices shown]
[im 6/52  bone]
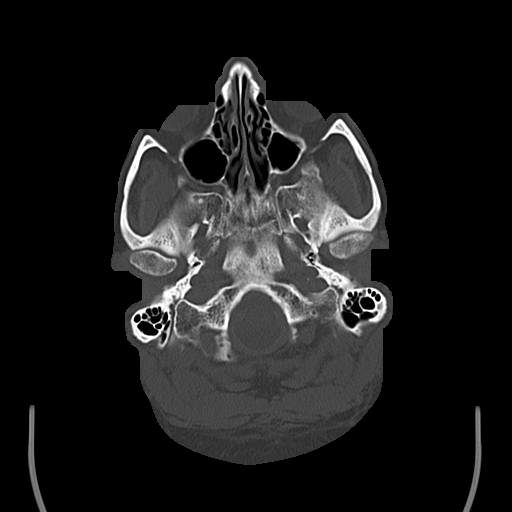
[im 12/52  bone]
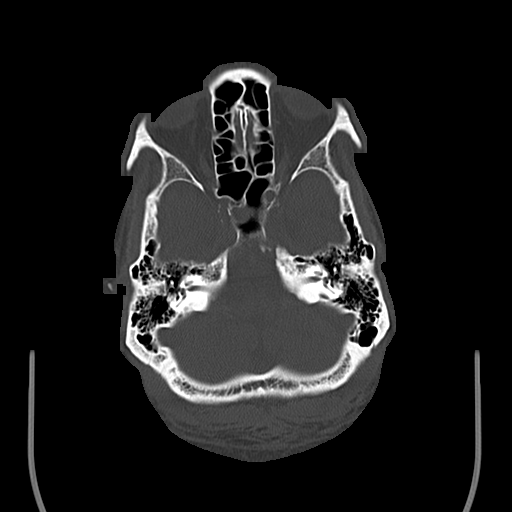
[im 18/52  bone]
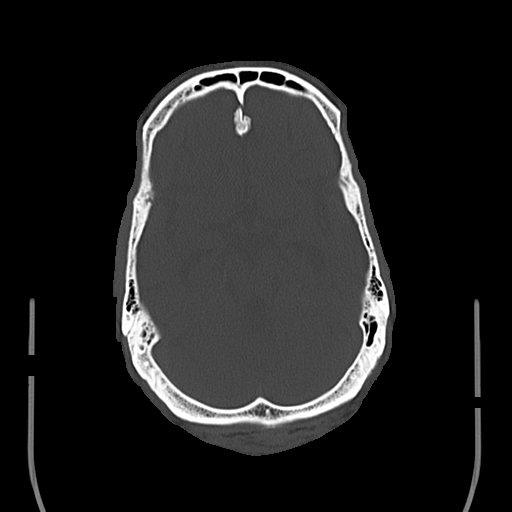
[im 23/52  bone]
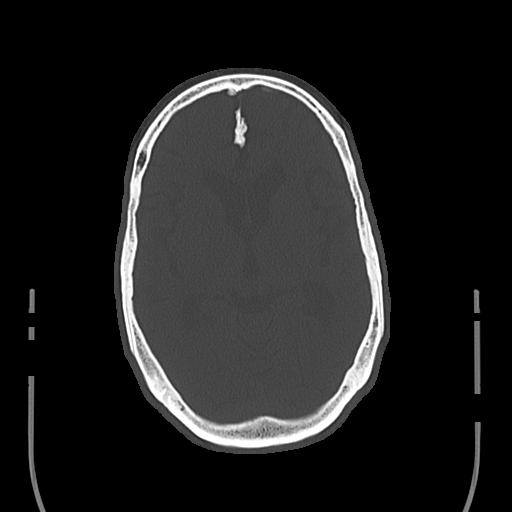
[im 29/52  bone]
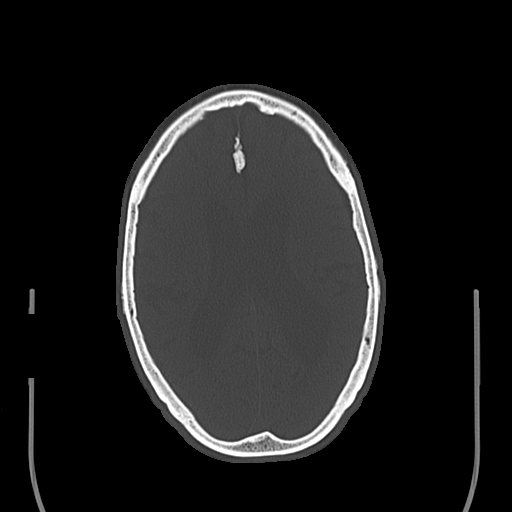
[im 35/52  bone]
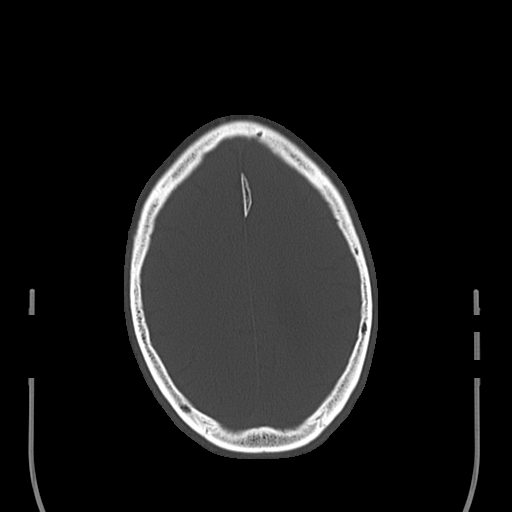
[im 40/52  bone]
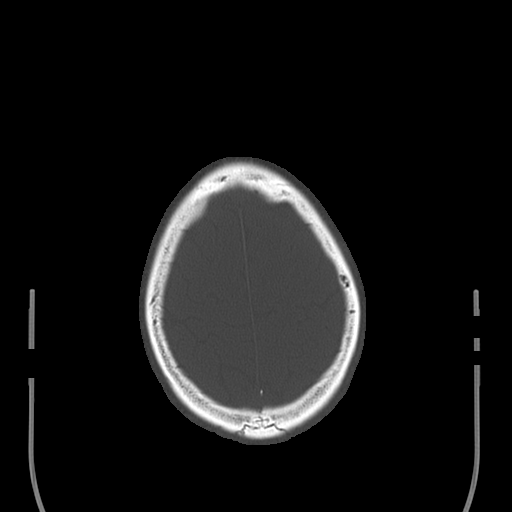
[im 46/52  bone]
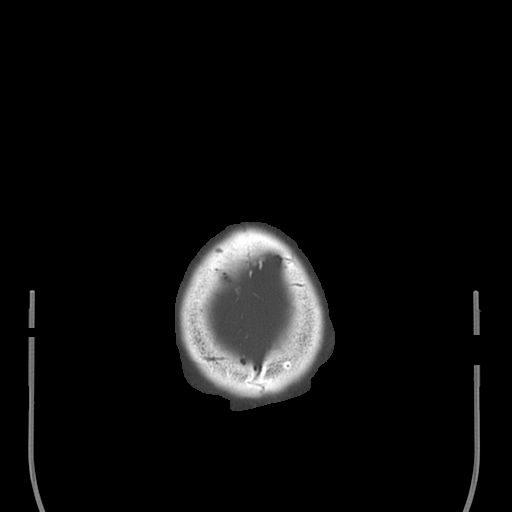

[17 of 30 positions shown; findings below may reference images not displayed]

FINDINGS: Progression of atrophy.  Negative for hydrocephalus.

Negative for acute infarct. Mild chronic microvascular ischemic
change in the white matter. Negative for hemorrhage or mass

Calvarium intact.
IMPRESSION: Progressive atrophy with mild chronic microvascular ischemic change.
No acute abnormality.

## 2016-09-24 IMAGING — CR DG CHEST 2V
2 series · 2 of 2 positions shown · non-contrast
Comparison: 09/25/2014

CLINICAL DATA: Cough for 1 week

EXAM:
CHEST  2 VIEW

[w chest lat]
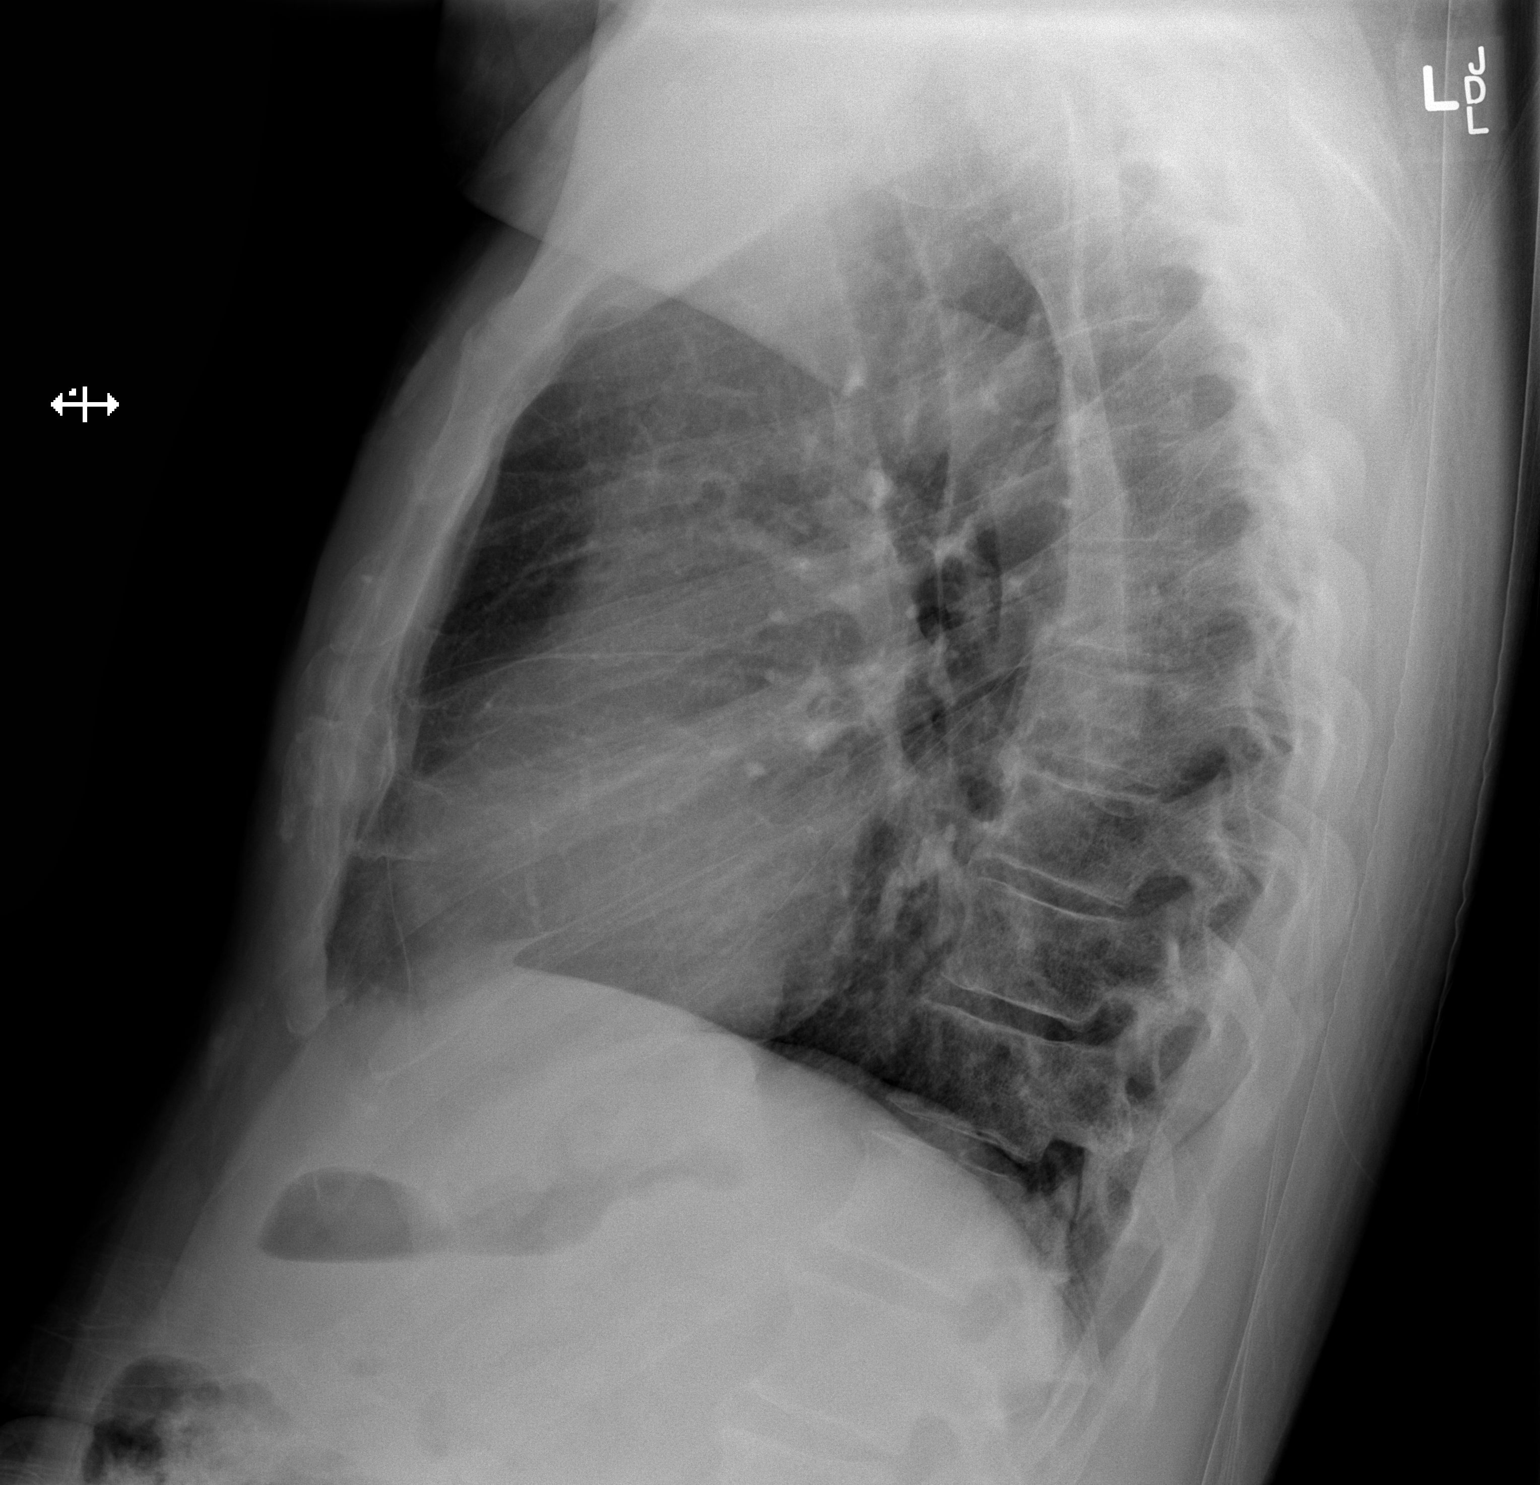

[x chest ap]
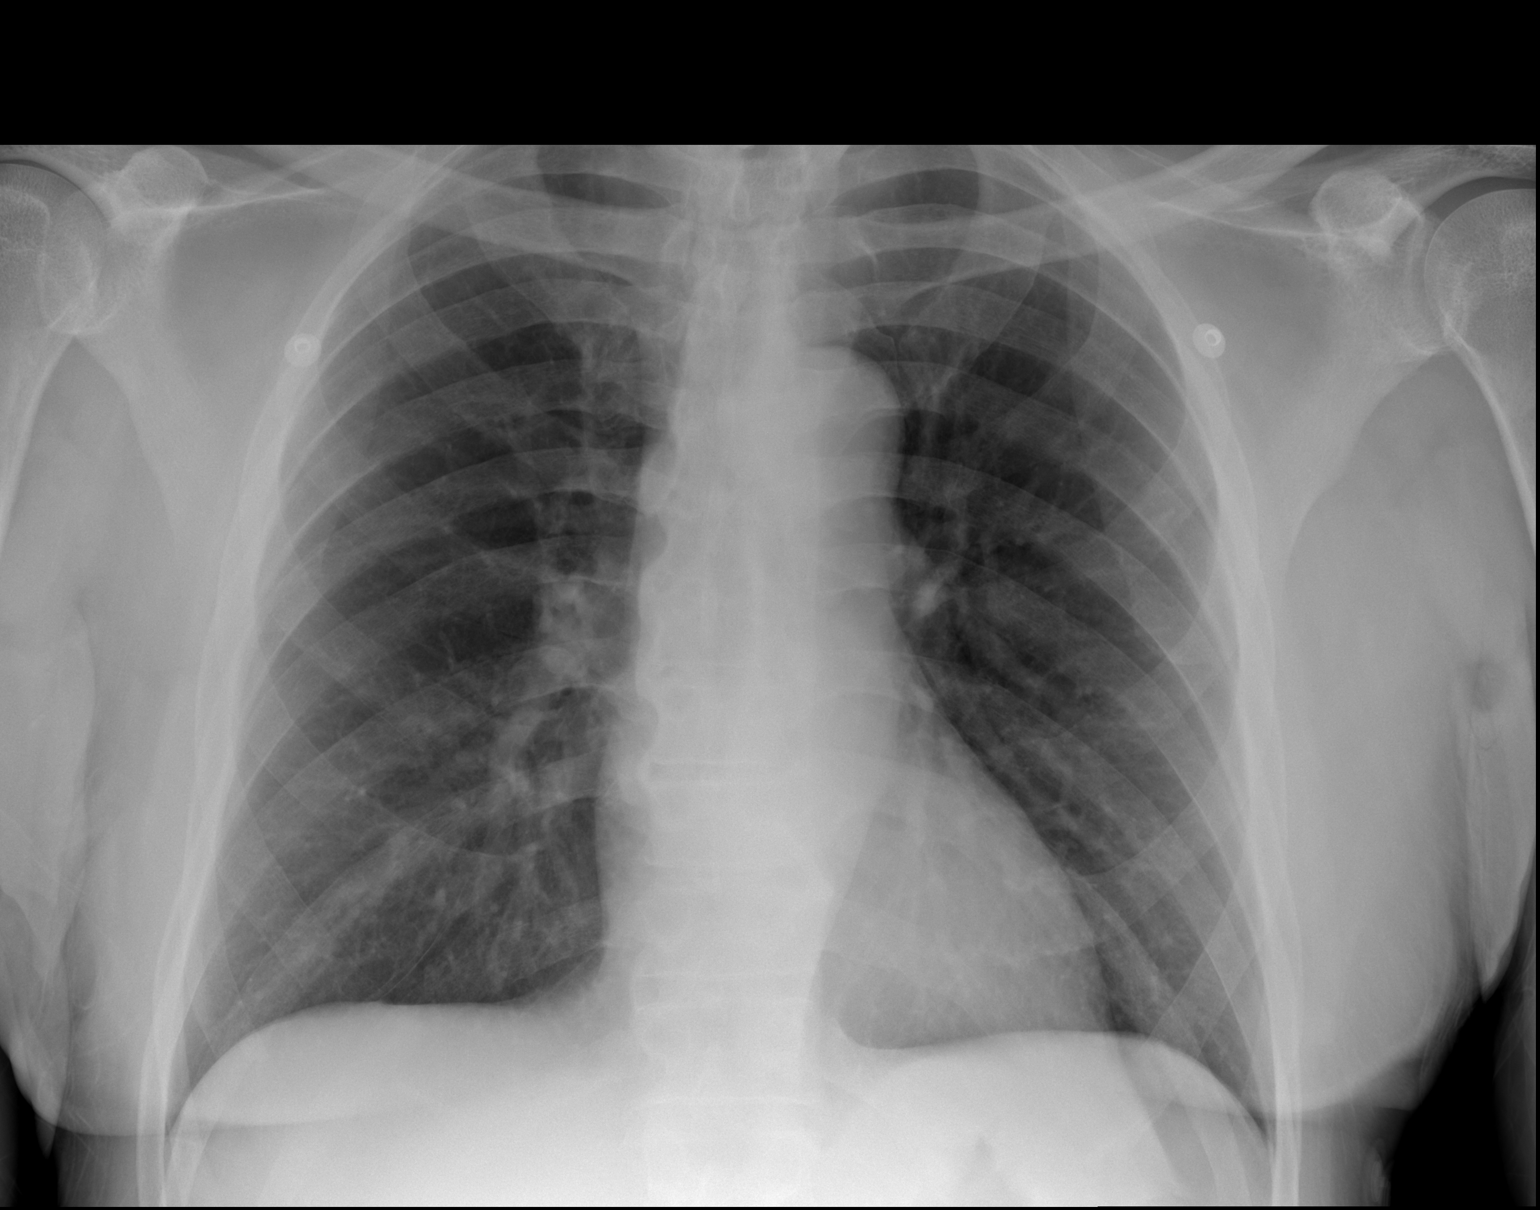

[2 of 2 positions shown; findings below may reference images not displayed]

FINDINGS: The heart size and mediastinal contours are within normal limits.
Both lungs are clear. The visualized skeletal structures are
unremarkable.
IMPRESSION: No active cardiopulmonary disease.
